# Patient Record
Sex: Male | Born: 1970
Health system: Southern US, Community
[De-identification: ages and names within clinical notes are randomized; demographics above are authoritative.]

## PROBLEM LIST (undated history)

## (undated) DIAGNOSIS — Z87442 Personal history of urinary calculi: Secondary | ICD-10-CM

## (undated) HISTORY — PX: APPENDECTOMY: SHX54

---

## 2001-04-02 ENCOUNTER — Ambulatory Visit (HOSPITAL_COMMUNITY): Admission: RE | Admit: 2001-04-02 | Discharge: 2001-04-02 | Payer: Self-pay | Admitting: Internal Medicine

## 2001-04-02 ENCOUNTER — Encounter: Payer: Self-pay | Admitting: Internal Medicine

## 2002-01-08 ENCOUNTER — Encounter: Payer: Self-pay | Admitting: Internal Medicine

## 2002-01-08 ENCOUNTER — Ambulatory Visit (HOSPITAL_COMMUNITY): Admission: RE | Admit: 2002-01-08 | Discharge: 2002-01-08 | Payer: Self-pay | Admitting: Internal Medicine

## 2007-08-14 ENCOUNTER — Other Ambulatory Visit: Admission: RE | Admit: 2007-08-14 | Discharge: 2007-08-14 | Payer: Self-pay | Admitting: Family Medicine

## 2007-08-14 ENCOUNTER — Encounter (INDEPENDENT_AMBULATORY_CARE_PROVIDER_SITE_OTHER): Payer: Self-pay | Admitting: Family Medicine

## 2007-08-17 ENCOUNTER — Inpatient Hospital Stay (HOSPITAL_COMMUNITY): Admission: EM | Admit: 2007-08-17 | Discharge: 2007-08-21 | Payer: Self-pay | Admitting: Emergency Medicine

## 2007-08-17 ENCOUNTER — Encounter (INDEPENDENT_AMBULATORY_CARE_PROVIDER_SITE_OTHER): Payer: Self-pay | Admitting: General Surgery

## 2007-09-13 ENCOUNTER — Ambulatory Visit: Payer: Self-pay | Admitting: Gastroenterology

## 2008-05-30 ENCOUNTER — Ambulatory Visit (HOSPITAL_COMMUNITY): Admission: RE | Admit: 2008-05-30 | Discharge: 2008-05-30 | Payer: Self-pay | Admitting: Family Medicine

## 2008-11-18 IMAGING — CT CT ABDOMEN W/O CM
1 of 2 series · 15 of 32 positions shown, 19 images · non-contrast
Comparison: None

CT ABDOMEN

CLINICAL DATA: Bilateral groin pain.  History of stones.

CT ABDOMEN AND PELVIS WITHOUT CONTRAST
TECHNIQUE: Multidetector CT imaging of the abdomen and pelvis was
performed following the standard
protocol without intravenous contrast.

[Series 2: stone 5.0 b40f · axial · 0.83mm/px · z∈[+462,+957]mm · 15 of 109 slices shown, 19 images]
[im 5/109  soft-tissue]
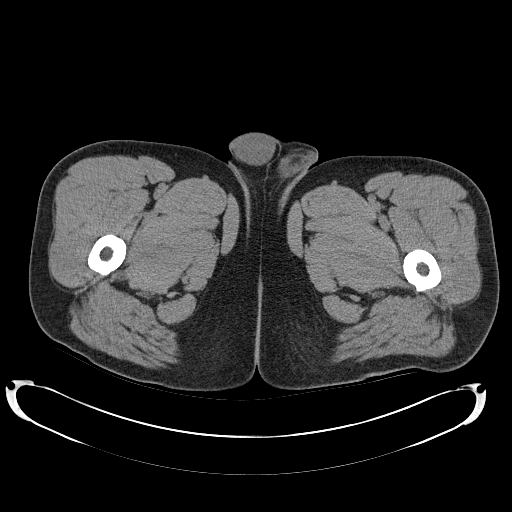
[im 5/109  bone]
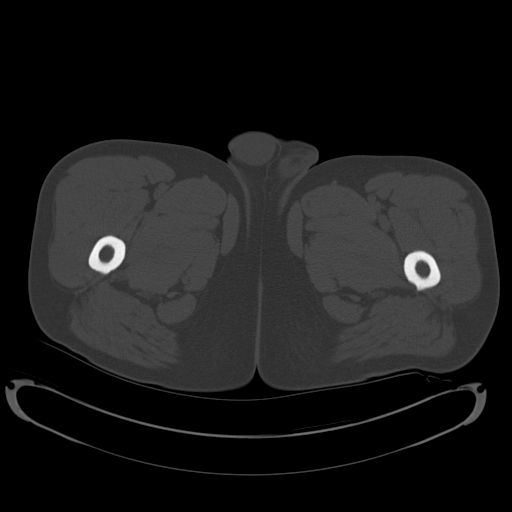
[im 13/109  soft-tissue]
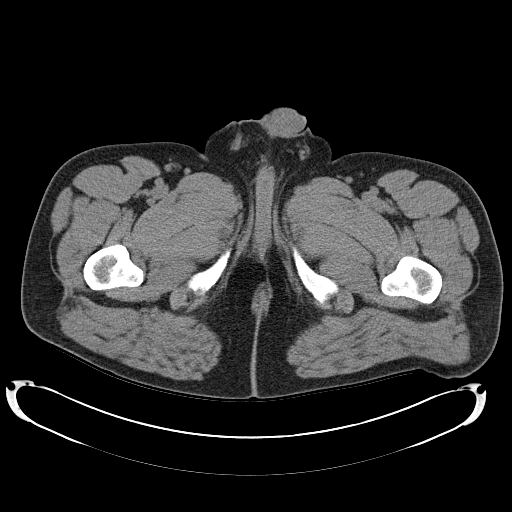
[im 22/109  soft-tissue]
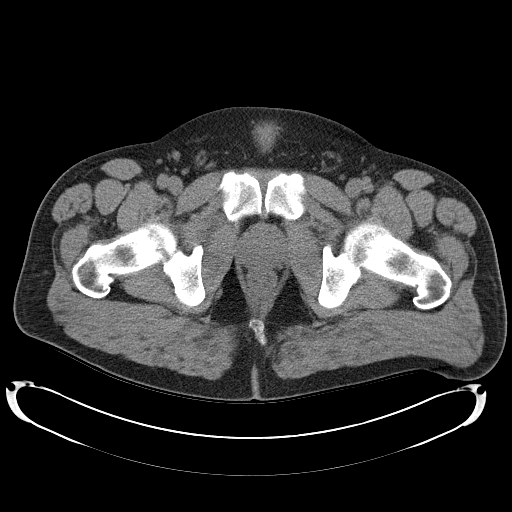
[im 31/109  soft-tissue]
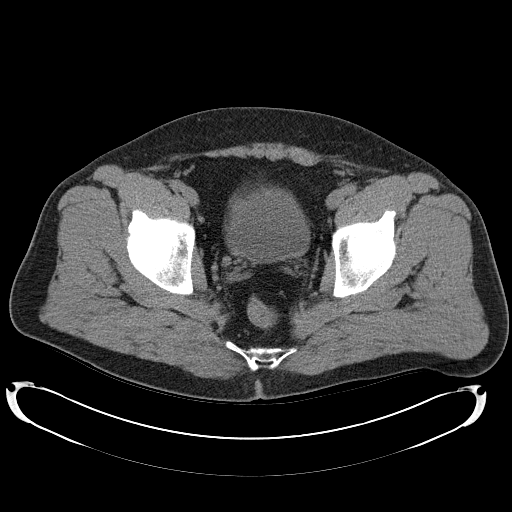
[im 39/109  soft-tissue]
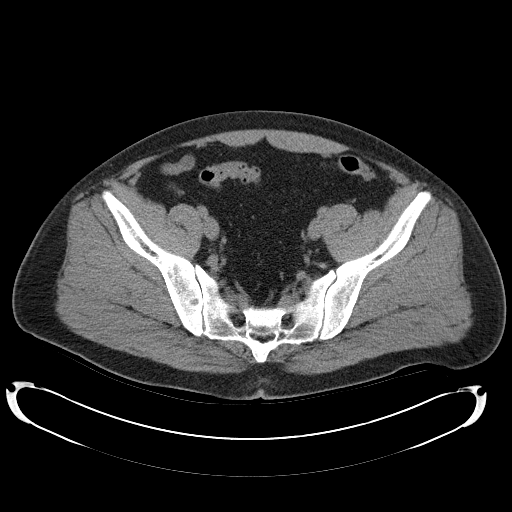
[im 48/109  soft-tissue]
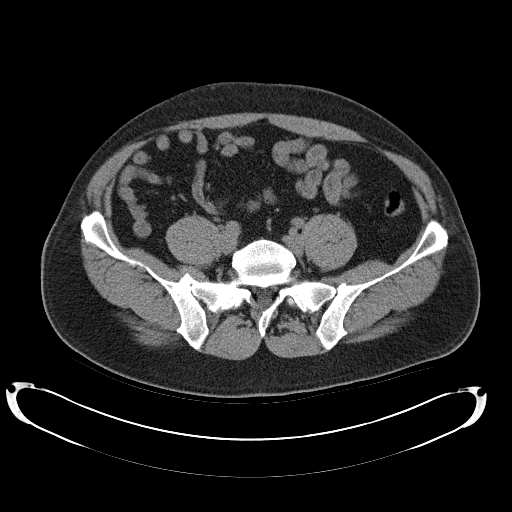
[im 57/109  soft-tissue]
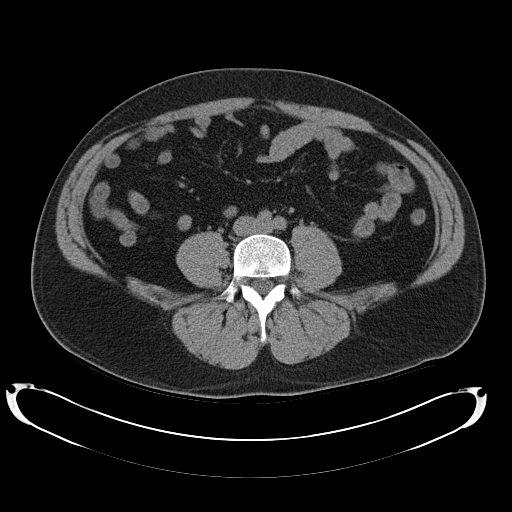
[im 61/109  soft-tissue]
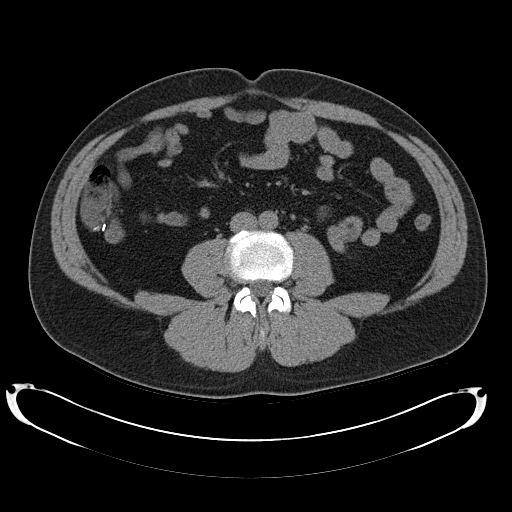
[im 70/109  soft-tissue]
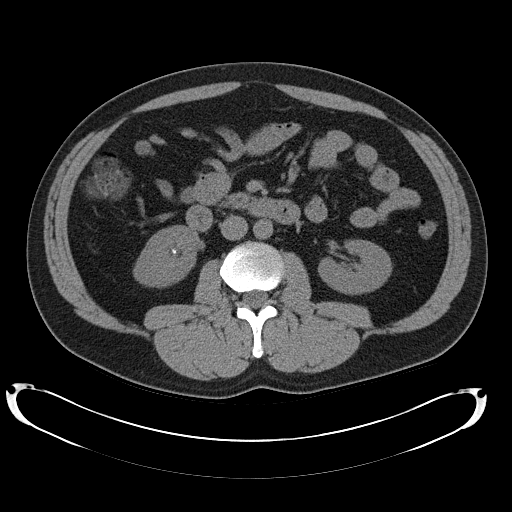
[im 70/109  bone]
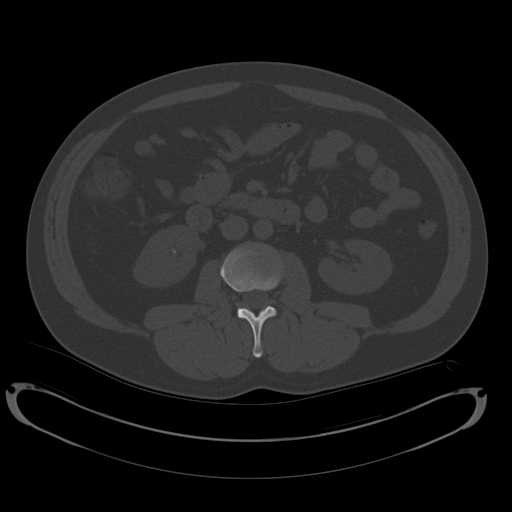
[im 78/109  soft-tissue]
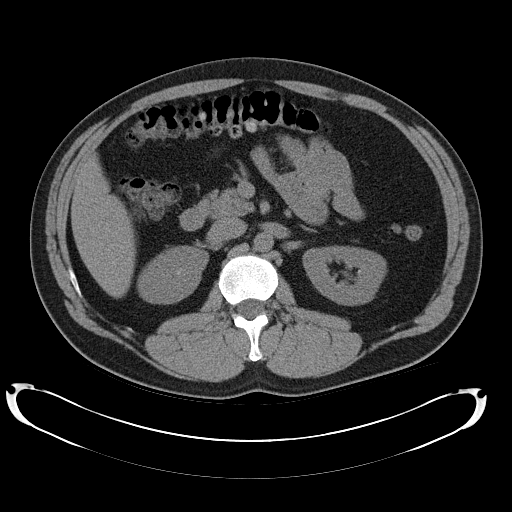
[im 87/109  soft-tissue]
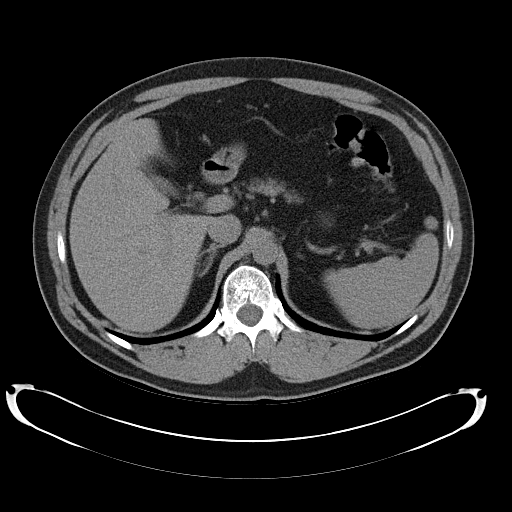
[im 91/109  lung]
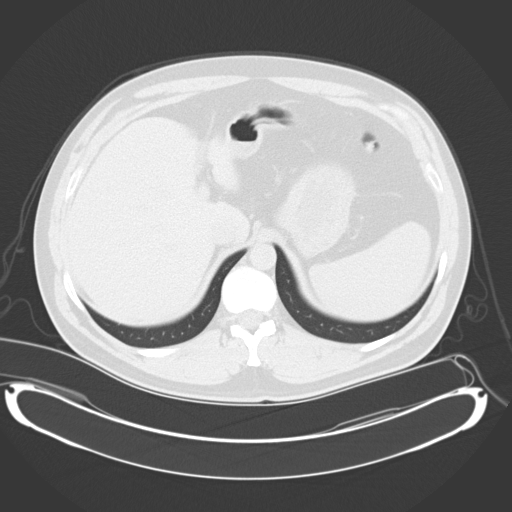
[im 96/109  soft-tissue]
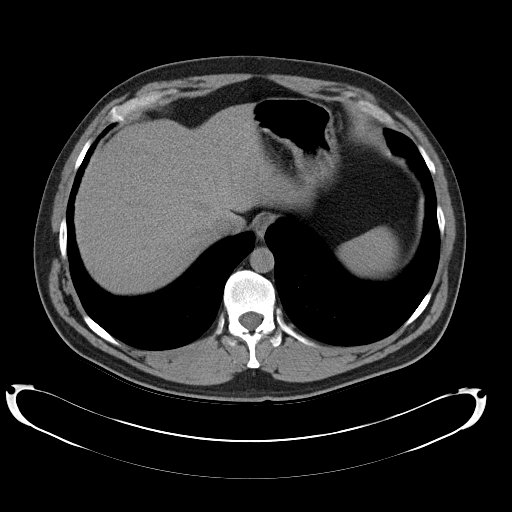
[im 96/109  lung]
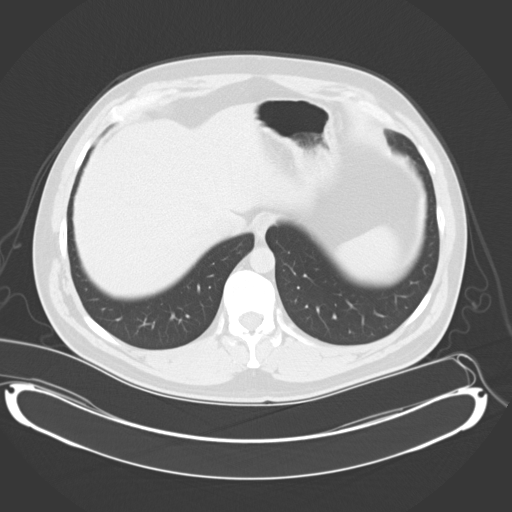
[im 100/109  lung]
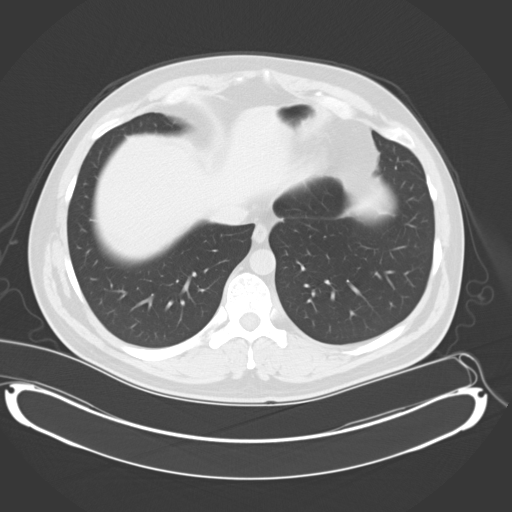
[im 104/109  soft-tissue]
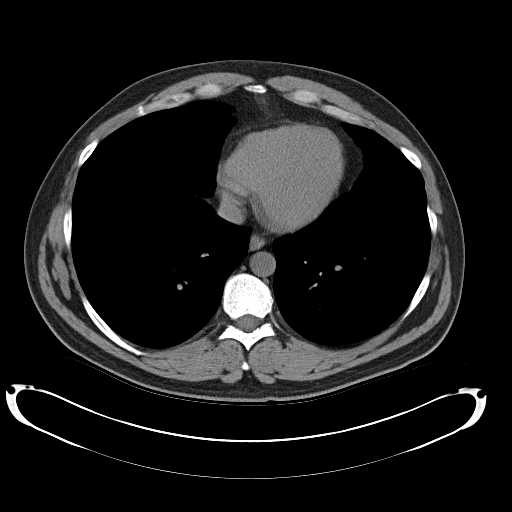
[im 104/109  lung]
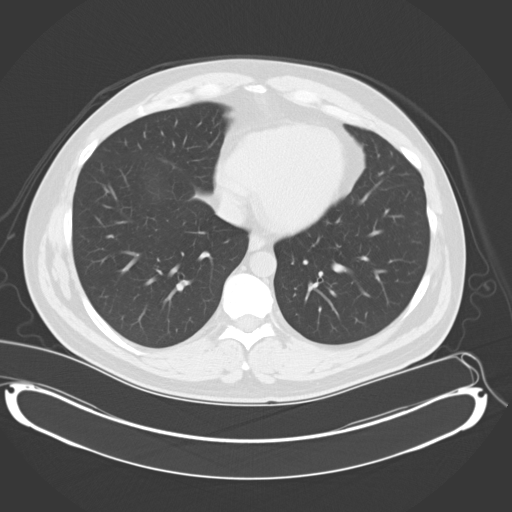

[15 of 32 positions shown; findings below may reference images not displayed]

FINDINGS: Lung bases show minimal scarring in the lingula.  Lung
bases otherwise clear.  Heart size normal.  No pericardial or
pleural effusion.

Liver, gallbladder, adrenal glands unremarkable.  There are small
stones in the kidneys bilaterally.  No hydronephrosis.  Spleen,
pancreas, stomach and small bowel unremarkable.  No pathologically
enlarged lymph nodes.  No free fluid.
IMPRESSION: 1.  Nonobstructing bilateral nephrolithiasis.

CT PELVIS
FINDINGS: There are small bilateral inguinal hernias containing
fat.  Colon unremarkable.  Postsurgical changes are seen from
suspected appendectomy.  No pathologically enlarged lymph nodes.
No free fluid.  No worrisome lytic or sclerotic lesions.
IMPRESSION: 1.  Small bilateral inguinal hernias contain fat.

## 2011-01-11 NOTE — Op Note (Signed)
Karl Aguilar, Karl Aguilar                 ACCOUNT NO.:  [id number]   MEDICAL RECORD NO.:  [id number]          PATIENT TYPE:  INP   LOCATION:  A310                          FACILITY:  [facility]   PHYSICIAN:  Barbaraann Barthel, M.D. DATE OF BIRTH:  07/25/1971   DATE OF PROCEDURE:  08/17/2007  DATE OF DISCHARGE:                               OPERATIVE REPORT   PREOPERATIVE DIAGNOSIS:  Acute appendicitis.   POSTOPERATIVE DIAGNOSIS:  1. Terminal ileitis.  2. Periappendicitis.   PROCEDURE:  Open appendectomy.   SURGEON:  Barbaraann Barthel, M.D.   SPECIMEN:  Appendix.   NOTE:  This is a 40 year old white male who had approximately a 12-14  hour history of right lower quadrant pain beginning at approximately 2  a.m. and increasing during the early morning hours the preceding day.  He came to Dr. Sherwood Gambler who was impressed that he had appendicitis and was  referred to me.  My clinical findings were highly suggestive of  appendicitis to the point where I did not think that a CT scan was  necessary as he had exquisite right lower quadrant pain at McBurney's  point.  His white count, however, was normal.  He was taken to surgery  with the presumptive diagnosis of acute appendicitis.   The procedure was discussed with him and his family in detail discussing  complications not limited to but including bleeding, infection and the  possibility of leaking from the peritoneal stump.  Informed consent was  obtained.   GROSS OPERATIVE FINDINGS:  The patient had a long retrocecal appendix  which was removed.  He had approximately 6 inches from the ileocecal  valve an area of terminal inflammation with mild amount of fat wrapping  and mild amount of suppuration around this area.  No abscess formation  or other areas and no obvious skip lesions found on the small bowel. No  mass was palpated.  The patient had no history of inflammatory bowel  disease or irritable bowel syndrome or history of any bloody diarrhea  to  suggest inflammatory bowel disease.  I suspect that this is a case of  terminal ileitis.   TECHNIQUE:  The patient was placed in the supine position after the  adequate administration of general anesthesia via endotracheal  intubation and a Foley catheter was aseptically inserted.  The abdomen  was prepped with Betadine solution and draped in the usual manner.  A  transverse incision was carried out over in the right lower quadrant  through skin, subcutaneous tissue through the rectus muscle sheath which  was divided and the rectus muscle was partially divided so the posterior  sheath was grasped with two clamps and the peritoneal cavity was then  opened and entered and explored with the above findings.  The appendix  was removed, clamping the mesoappendix and ligating it with 2-0 silk and  then dividing it with a TA-30 stapler device.  I used TA-30 stapler  twice as the first stapler misfired.  We then irrigated with normal  saline solution.  I ran the small bowel and there was an area  approximately  6 inches or so from the ileocecal valve that was  indurated.  There was no tumor present but it showed some inflammation  with mild suppuration around its sides.  We then after irrigating closed  the peritoneum with a running 0 Polysorb suture and the fascia with  interrupted figure-of-eight Polysorb sutures.  The subcu was irrigated  and the skin was approximated using stapling device.  I used a 0.5%  Sensorcaine approximately 20 mL around the incision to help with  postoperative comfort.  Sterile dressing was applied.  Prior to closure  all sponge, needle and instrument counts were found to be correct.  Estimated blood loss was minimal.  The patient received 1500-1600 mL of  crystalloids intraoperatively.  No drains were placed and there were no  complications.   I discussed the findings in detail with the family, also stating that  the patient will require antibiotics and will have  a GI consult if  necessary down the line and at least an evaluation further of his small  bowel.      Barbaraann Barthel, M.D.  Electronically Signed     WB/MEDQ  D:  08/17/2007  T:  08/19/2007  Job:  [id number]   cc:   Madelin Rear. Sherwood Gambler, MD  Fax: [phone]   R. Roetta Sessions, M.D.  [address]  [place]  [place] [address]

## 2011-01-11 NOTE — H&P (Signed)
Karl Aguilar, Karl Aguilar                 ACCOUNT NO.:  [id number]   MEDICAL RECORD NO.:  [id number]          PATIENT TYPE:  INP   LOCATION:  A310                          FACILITY:  [facility]   PHYSICIAN:  Barbaraann Barthel, M.D. DATE OF BIRTH:  1971/01/06   DATE OF ADMISSION:  08/17/2007  DATE OF DISCHARGE:  LH                              HISTORY & PHYSICAL   This is a 40 year old white male who presented to Dr. Sherwood Gambler with a less  than 12-hour history of right lower quadrant pain.   HISTORY OF PRESENT ILLNESS:  The patient states that he was awakened at  approximately 2:00 a.m. this morning with exquisite right lower quadrant  pain and this did not improve when he tried to move his bowels and  worsened so that he sought medical attention later on in the morning and  the clinical impression was that of acute appendicitis. Dr. Sherwood Gambler then  referred him to me.  I examined him in the emergency room and signs and  symptoms were pretty clearly clinically of acute appendicitis, so  classic that I really did not feel that a CT scan was necessary and did  not order one.   PHYSICAL EXAMINATION:  HEAD:  Normocephalic.  EYES:  Extraocular movements intact. Pupils are round and react to light  and accommodation.  There is no conjunctive pallor or scleral  injections. Scleral is a normal tincture.  NECK:  Supple and cylindrical without jugular vein distention,  thyromegaly or tracheal deviation. There is no cervical adenopathy.  No  bruits are auscultated.  CHEST:  Clear but diminished breath sounds bilaterally.  HEART:  Regular.  ABDOMEN:  The patient has diminished bowel sounds.  The patient has  exquisite right lower quadrant pain at McBurney's point with guarding.  No femoral inguinal hernias are appreciated.  GENITALIA:  Within normal limits.  RECTAL:  Prostate is smooth and the stool is guaiac negative.  EXTREMITIES:  Within normal limits without clubbing, varicosities or  cyanosis.   REVIEW OF  SYSTEMS:  CARDIORESPIRATORY:  The patient smokes a pack of  cigarettes per day.  No history of alcohol abuse. ENDOCRINE:  No history  of diabetes or thyroid disease. GU:  History of nephrolithiasis but no  dysuria present and the pain that he is having is not anywhere similar  to when the patient was having a renal colic. GI:  The patient has right  lower quadrant pain with nausea and anorexia.  No vomiting.  No history  of bright red rectal bleeding, black tarry stools or changes in his  bowel habit. No history of unexplained weight loss.  No history of  inflammatory bowel disease or inflammatory or irritable bowel syndrome.  No past history of hepatitis.   MEDICATIONS:  No medications on a regular basis.   ALLERGIES:  The patient has some nausea when he takes CODEINE but no  allergy per se.   As stated, he smokes a pack of cigarettes per day.  No history of  alcohol abuse.   VITAL SIGNS:  His temperature is 98.3.  His height is  6 feet and he  weighs 212.5 pounds.  His blood pressure is 145/91, heart rate 108,  respirations 20, O2 sat 100% on room air.   LABORATORY DATA:  Pending at the time of this dictation. It has been  ordered stat.   PLAN:  We will plan for laparotomy for appendectomy as discussed. We  discussed this in detail with him discussing complications not limited  to but including bleeding, infection and appendiceal stump leak.  Informed consent was obtained. We have initiated hydration and  antibiotic therapy and will plan for surgery as soon as possible.      Barbaraann Barthel, M.D.  Electronically Signed     WB/MEDQ  D:  08/17/2007  T:  08/18/2007  Job:  [id number]   cc:   Madelin Rear. Sherwood Gambler, MD  Fax: [phone]

## 2011-01-11 NOTE — Discharge Summary (Signed)
Karl Aguilar, KOHLENBERG                 ACCOUNT NO.:  [id number]   MEDICAL RECORD NO.:  [id number]          PATIENT TYPE:  INP   LOCATION:  A310                          FACILITY:  [facility]   PHYSICIAN:  Barbaraann Barthel, M.D. DATE OF BIRTH:  01/02/1971   DATE OF ADMISSION:  08/17/2007  DATE OF DISCHARGE:  12/23/2008LH                               DISCHARGE SUMMARY   DIAGNOSIS:  Terminal ileitis or periappendicitis (Final pathology  pending).   PROCEDURE:  On August 17, 2007, exploratory laparotomy and open  appendectomy.   NOTE:  This is a 40 year old white male who presented to the emergency  room with signs and symptoms of acute appendicitis.  He was seen earlier  by Dr. Sherwood Gambler without presumptive diagnosis, and clinically he had  exquisite right lower quadrant pain over McBurney's point with nausea  and vomiting.  I did not feel a CT scan was necessary with these  findings. His white count was normal.  However, his pain and symptoms  were that of acute appendicitis, and I took him to surgery without the  benefit of CT scan.   At surgery, he had a corkscrew retroperitoneal appendix that had peri-  inflammation, and an area of inflammation was noted about 6-8 cm from  the ileocecal junction.  This was localized. The rest of the small bowel  appeared to be normal.  Clinically, he had no previous history of any  inflammatory bowel disease in his family or any history of irritable  bowel syndrome and bloody diarrhea or anything suggesting an  inflammatory bowel procedure.  Clinically, this was not mass or a tumor.  This was inflammation with some thickened area and mild suppuration  noted around it.  I did not do a resection.  I washed out the right  lower quadrant and performed an appendectomy.   Postoperatively, the patient was placed on Cipro and Flagyl.  He was  discharged on the fourth postoperative day.  At the time of discharge,  his wound was clean.  He was moving his bowels  well.  He had no dysuria,  leg pain or shortness of breath.  He did have some mild superficial  phlebitis in his left upper arm from an IV placement and some chemical  phlebitis from his antibiotic infusion.  Otherwise, his postoperative  course was completely benign.  He did take a dip in his hemoglobin and  hematocrit; however, this was likely a dilutional problem.  On August 20, 2007, hemoglobin was 12.5 and hematocrit 36.8. At the time of  discharge, hemoglobin 13.3 and hematocrit  39.5.  His electrolytes were  grossly within normal limits.   As stated, his hospital course was benign.  He was discharged on Cipro  500 mg every 12 hours for another 5 days, Flagyl 500 mg every 8 hours  for 5 days.  He was told take no alcohol as this would cause him to have  problems with his Flagyl. He was given a prescription for Darvocet-N 100  one tablet every 4 hours for pain, Colace 100 mg to be taken daily  b.i.d. or every other day depending upon and the need for his bowels.  He was told to take no aspirin products and apply warm compresses for 20  minutes three times a day for his left upper arm phlebitis. He is  discharged on a full liquid and soft diet.  He was told to clean his  surgical wound with alcohol three times a day.  He was told to do no  heavy lifting, no driving, no sexual activities.  He is permitted to go  indoors, outdoors and up and down the stairs, however.  He is excused  from work, and we will see him in my office on December 29 at 2 o'clock.  He has been given appointment. He is told to contact us should there be  any acute changes.   Please note:  This patient will need further workup of his small bowel,  and we will consult later with Dr. Jena Gauss or Dr. Cira Servant regarding this.  He will likely need a small-bowel series or further GI workup regarding  his terminal ileitis.  Meanwhile, we will follow him perioperatively in  the office.      Barbaraann Barthel, M.D.   Electronically Signed     WB/MEDQ  D:  08/21/2007  T:  08/21/2007  Job:  [id number]   cc:   Madelin Rear. Sherwood Gambler, MD  Fax: [phone]   R. Roetta Sessions, M.D.  [address]  [place]  [place] [address]   Kassie Mends, M.D.  [address]  [place] , [place] [address]

## 2011-01-11 NOTE — Discharge Summary (Signed)
NAMEBYRAN, BILOTTI                 ACCOUNT NO.:  [id number]   MEDICAL RECORD NO.:  [id number]          PATIENT TYPE:  INP   LOCATION:  A310                          FACILITY:  [facility]   PHYSICIAN:  Barbaraann Barthel, M.D. DATE OF BIRTH:  12-23-1970   DATE OF ADMISSION:  08/17/2007  DATE OF DISCHARGE:  12/23/2008LH                               DISCHARGE SUMMARY   PROCEDURE:  Exploratory laparotomy and open appendectomy on August 17, 2007.   DIAGNOSIS:  Terminal ileitis and with periappendicitis.  Final pathology  pending.   Note this is a 40 year old white male who presented to the emergency  room with signs and symptoms of acute appendicitis.  He was seen earlier  by Dr. Sherwood Gambler who also had that impression.   His findings were exquisite right lower quadrant pain with nausea and  vomiting and and I did not feel the CT scan was warranted and he was  taken to surgery where in fact he was found to have terminal ileitis, an  area of approximately 6 to 8 inches from the ileocecal valve or the  ileocecal junction.  There was an area of localized inflammation.  There  was no other inflammation noted on the terminal.  The small bowel was  run.  There was no  to be the small bowel was run there was no   Please disregard this dictation   Dictation stopped.      Barbaraann Barthel, M.D.     WB/MEDQ  D:  08/21/2007  T:  08/22/2007  Job:  [id number]

## 2011-01-11 NOTE — Assessment & Plan Note (Signed)
NAMEROGER, KETTLES                  CHART#:  [id number]   DATE:  09/13/2007                       DOB:  1971-08-25   REFERRING PHYSICIAN:  Dr. Laurice Record and Dr. Malvin Johns.   DATE OF VISIT:  September 13, 2007.   REASON FOR CONSULTATION:  Small bowel ileitis.   HISTORY OF PRESENT ILLNESS:  Mr. Repsher is a 40 year old male who  presented to his primary physician in December 2008 with persistent  excruciating right lower quadrant pain.  He was taken to the operating  room for an exploratory laparotomy and an appendectomy was performed.  On gross examination six inches from the ileocecal valve showed  inflammation with a mild amount of fat wrapping and separation.  No  abscess or other lesions were seen in the small bowel.  Pathology on the  removed appendix showed benign appendiceal tissue with no significant  abnormalities.  He was placed on Cipro and Flagyl and completed his  antibiotics.  He has had no significant abdominal pain since having his  appendix removed.  He denies any diarrhea, rectal bleeding, constipation  or vomiting.  He does not use aspirin or anti inflammatory drugs because  they cause nausea.  He does have well water.  He takes Tylenol for  headaches.   PAST MEDICAL HISTORY:  Kidney stones.   PAST SURGICAL HISTORY:  Appendectomy.   ALLERGIES:  CODEINE and ASPIRIN.   MEDICATIONS:  None.   FAMILY HISTORY:  He has no family history of colon cancer, colon polyps,  ulcerative colitis or Crohn's disease.   SOCIAL HISTORY:  He is married for 16 years.  He works for a [employer].  He smokes 3/4 to 1 pack a day.  He occasionally consumes  alcohol.   PHYSICAL EXAMINATION:  Weight 209 pounds.  Height 5 foot 11.  BMI 29.1  (overweight).  Temperature 97.6, blood pressure 110/70, pulse 88.  GENERAL:  He is in no apparent distress.  Alert and oriented times four.  HEENT:  Atraumatic, normocephalic.  Pupils equal, reactive to light.  MOUTH:  No oral lesions.  Posterior  pharynx without erythema or exudate.  NECK:  Full range of motion.  No lymphadenopathy.  LUNGS:  Clear to auscultation bilaterally.  CARDIOVASCULAR EXAM:  Regular rhythm.  No murmur.  Normal S1 and S2.  ABDOMEN:  Bowel sounds are present.  Soft, nontender, non distended.  No  rebound.  No guarding.  EXTREMITIES:  Have no cyanosis or edema.  NEURO:  He has no focal neurologic deficits.   ASSESSMENT:  Mr. Mcquiston is a 40 year old male who had terminal ileitis,  most likely infectious in etiology.  His symptoms have resolved. Thank  you for allowing me to see Mr. Alonso in consultation.  My  recommendations follow.   RECOMMENDATIONS:  Mr. Volkert may follow up with me as needed.  He  understands that if his pain returns he should make a follow-up  appointment and we can re-evaluate him at that time.       Kassie Mends, M.D.  Electronically Signed     SM/MEDQ  D:  09/13/2007  T:  09/13/2007  Job:  [id number]   cc:   Barbaraann Barthel, M.D.

## 2011-04-26 ENCOUNTER — Emergency Department (HOSPITAL_COMMUNITY)
Admission: EM | Admit: 2011-04-26 | Discharge: 2011-04-26 | Disposition: A | Discharge status: Home / Self Care | Payer: Worker's Compensation | Attending: Emergency Medicine | Admitting: Emergency Medicine

## 2011-04-26 DIAGNOSIS — W208XXA Other cause of strike by thrown, projected or falling object, initial encounter: Secondary | ICD-10-CM | POA: Insufficient documentation

## 2011-04-26 DIAGNOSIS — IMO0002 Reserved for concepts with insufficient information to code with codable children: Secondary | ICD-10-CM | POA: Insufficient documentation

## 2011-04-26 DIAGNOSIS — S62639A Displaced fracture of distal phalanx of unspecified finger, initial encounter for closed fracture: Secondary | ICD-10-CM | POA: Insufficient documentation

## 2011-04-26 DIAGNOSIS — M79609 Pain in unspecified limb: Secondary | ICD-10-CM | POA: Insufficient documentation

## 2011-04-26 DIAGNOSIS — S61209A Unspecified open wound of unspecified finger without damage to nail, initial encounter: Secondary | ICD-10-CM | POA: Insufficient documentation

## 2011-04-26 DIAGNOSIS — Y990 Civilian activity done for income or pay: Secondary | ICD-10-CM | POA: Insufficient documentation

## 2011-06-03 LAB — BASIC METABOLIC PANEL
BUN: 12
BUN: 9
CO2: 27
CO2: 28
Calcium: 8 — ABNORMAL LOW
Calcium: 9.4
Chloride: 102
Chloride: 105
Creatinine, Ser: 1.13
GFR calc Af Amer: >60
GFR calc Af Amer: >60
GFR calc non Af Amer: >60
GFR calc non Af Amer: >60
Glucose, Bld: 110 — ABNORMAL HIGH
Glucose, Bld: 90
Potassium: 3.6
Potassium: 4
Potassium: 4.2
Sodium: 136
Sodium: 138

## 2011-06-03 LAB — DIFFERENTIAL
Basophils Absolute: 0.1
Basophils Absolute: 0.1
Basophils Absolute: 0.2 — ABNORMAL HIGH
Basophils Relative: 1
Basophils Relative: 2 — ABNORMAL HIGH
Eosinophils Absolute: 0.4
Eosinophils Absolute: 0.6
Eosinophils Relative: 1
Eosinophils Relative: 3
Eosinophils Relative: 6 — ABNORMAL HIGH
Lymphocytes Relative: 15
Lymphocytes Relative: 16
Lymphocytes Relative: 21
Lymphs Abs: 2
Lymphs Abs: 2
Lymphs Abs: 2.6
Monocytes Absolute: 1
Monocytes Absolute: 1.6 — ABNORMAL HIGH
Monocytes Relative: 10
Monocytes Relative: 10
Neutro Abs: 11.7 — ABNORMAL HIGH
Neutro Abs: 6.1
Neutro Abs: 9.8 — ABNORMAL HIGH
Neutrophils Relative %: 62
Neutrophils Relative %: 71

## 2011-06-03 LAB — CBC
HCT: 36.8 — ABNORMAL LOW
HCT: 40.7
Hemoglobin: 12.5 — ABNORMAL LOW
Hemoglobin: 13.9
MCHC: 33.7
MCHC: 33.9
MCHC: 34.1
MCV: 86.5
MCV: 86.8
MCV: 86.9
Platelets: 262
Platelets: 267
RBC: 4.26
RBC: 4.68
RDW: 13.1
RDW: 13.2
RDW: 13.4
WBC: 13.4 — ABNORMAL HIGH
WBC: 9.8

## 2011-06-03 LAB — HEMOGLOBIN AND HEMATOCRIT, BLOOD
HCT: 39.5
Hemoglobin: 13.3

## 2013-07-28 ENCOUNTER — Emergency Department (HOSPITAL_COMMUNITY): Payer: BC Managed Care – PPO

## 2013-07-28 ENCOUNTER — Emergency Department (HOSPITAL_COMMUNITY)
Admission: EM | Admit: 2013-07-28 | Discharge: 2013-07-28 | Disposition: A | Discharge status: Home / Self Care | Payer: BC Managed Care – PPO | Attending: Emergency Medicine | Admitting: Emergency Medicine

## 2013-07-28 ENCOUNTER — Encounter (HOSPITAL_COMMUNITY): Payer: Self-pay | Admitting: Emergency Medicine

## 2013-07-28 DIAGNOSIS — K8050 Calculus of bile duct without cholangitis or cholecystitis without obstruction: Secondary | ICD-10-CM

## 2013-07-28 DIAGNOSIS — F17200 Nicotine dependence, unspecified, uncomplicated: Secondary | ICD-10-CM | POA: Insufficient documentation

## 2013-07-28 DIAGNOSIS — R079 Chest pain, unspecified: Secondary | ICD-10-CM | POA: Insufficient documentation

## 2013-07-28 DIAGNOSIS — R61 Generalized hyperhidrosis: Secondary | ICD-10-CM | POA: Insufficient documentation

## 2013-07-28 DIAGNOSIS — K8020 Calculus of gallbladder without cholecystitis without obstruction: Secondary | ICD-10-CM | POA: Insufficient documentation

## 2013-07-28 LAB — CBC WITH DIFFERENTIAL/PLATELET
Basophils Relative: 1 % (ref 0–1)
Eosinophils Relative: 3 % (ref 0–5)
HCT: 49.6 % (ref 39.0–52.0)
Hemoglobin: 16.5 g/dL (ref 13.0–17.0)
MCH: 29.7 pg (ref 26.0–34.0)
MCHC: 33.3 g/dL (ref 30.0–36.0)
MCV: 89.2 fL (ref 78.0–100.0)
Monocytes Absolute: 0.8 10*3/uL (ref 0.1–1.0)
Monocytes Relative: 7 % (ref 3–12)
Neutro Abs: 7.4 10*3/uL (ref 1.7–7.7)

## 2013-07-28 LAB — COMPREHENSIVE METABOLIC PANEL
Albumin: 3.8 g/dL (ref 3.5–5.2)
BUN: 17 mg/dL (ref 6–23)
CO2: 25 mEq/L (ref 19–32)
Calcium: 9.6 mg/dL (ref 8.4–10.5)
Chloride: 101 mEq/L (ref 96–112)
Creatinine, Ser: 1.1 mg/dL (ref 0.50–1.35)
GFR calc non Af Amer: 81 mL/min — ABNORMAL LOW (ref >90)
Total Bilirubin: 0.3 mg/dL (ref 0.3–1.2)

## 2013-07-28 LAB — TROPONIN I: Troponin I: <0.3 ng/mL (ref <0.30)

## 2013-07-28 LAB — LIPASE, BLOOD: Lipase: 76 U/L — ABNORMAL HIGH (ref 11–59)

## 2013-07-28 MED ORDER — HYDROCODONE-ACETAMINOPHEN 5-325 MG PO TABS: 2.0000 | ORAL_TABLET | ORAL | Status: DC | PRN

## 2013-07-28 MED ORDER — GI COCKTAIL ~~LOC~~
ORAL | Status: AC
  Filled 2013-07-28: qty 30

## 2013-07-28 MED ORDER — GI COCKTAIL ~~LOC~~
30.0000 mL | Freq: Once | ORAL | Status: AC
  Administered 2013-07-28: 30 mL via ORAL

## 2013-07-28 NOTE — ED Provider Notes (Signed)
CSN: [id number]     Arrival date & time 07/28/13  2440 History   This chart was scribed for Geoffery Lyons, MD, by Yevette Edwards, ED Scribe. This patient was seen in room APA02/APA02 and the patient's care was started at 7:05 AM.   First MD Initiated Contact with Patient 07/28/13 [phone]     Chief Complaint  Patient presents with  . Chest Pain    The history is provided by the patient and the spouse. No language interpreter was used.   HPI Comments: Karl Aguilar is a 42 y.o. male who presents to the Emergency Department complaining of acute mid-center, non-radiating chest pain which woke him from sleep two hours ago this morning. He characterizes the chest pain as "burning" and "sharp" like a knife. He reports he has never experienced similar chest pain. The pt states that the pain is improved with belching, and that at bedside the pain has subsided to a dullness. He experienced mild diaphoresis with the chest pain, but he attributes it to the multiple layers of clothes he was wearing. The pt denies any SOB, nausea, dizziness, lower extremity swelling, abdominal pain, or other pain. 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 He ate fried fish and hush-puppies yesterday evening. He denies high cholesterol, DM, gallbladder issues, or other pertinent health problems. His father had a mild MI when he was 41 years old. The pt is a daily smoker.   History reviewed. No pertinent past medical history. Past Surgical History  Procedure Laterality Date  . Appendectomy     No family history on file. History  Substance Use Topics  . Smoking status: Current Every Day Smoker -- 1.00 packs/day    Types: Cigarettes  . Smokeless tobacco: Not on file  . Alcohol Use: Yes    Review of Systems  Constitutional: Positive for diaphoresis.  Respiratory: Negative for shortness of breath.   Cardiovascular: Positive for chest  pain. Negative for leg swelling.  Gastrointestinal: Negative for nausea, vomiting and abdominal pain.  Neurological: Negative for dizziness.  All other systems reviewed and are negative.    Allergies  Asa  Home Medications  No current outpatient prescriptions on file.  Triage Vitals: BP 114/87  Pulse 79  Temp(Src) 97.6 F (36.4 C) (Oral)  Resp 16  Ht 5\' 11"  (1.803 m)  Wt 205 lb (92.987 kg)  BMI 28.60 kg/m2  SpO2 96%  Physical Exam  Nursing note and vitals reviewed. Constitutional: He is oriented to person, place, and time. He appears well-developed and well-nourished. No distress.  HENT:  Head: Normocephalic and atraumatic.  Eyes: Conjunctivae and EOM are normal. Pupils are equal, round, and reactive to light.  Neck: Neck supple. No tracheal deviation present.  Cardiovascular: Normal rate, regular rhythm, normal heart sounds and intact distal pulses.   No murmur heard. Pulmonary/Chest: Effort normal and breath sounds normal. No respiratory distress. He has no wheezes.  Abdominal: Soft. Bowel sounds are normal. He exhibits no distension. There is no tenderness. There is no rebound and no guarding.  Musculoskeletal: Normal range of motion. He exhibits no edema.  Neurological: He is alert and oriented to person, place, and time.  Skin: Skin is warm and dry.  Psychiatric: He has a normal mood and affect. His behavior is normal.    ED Course  Procedures (including critical care time)  DIAGNOSTIC STUDIES: Oxygen Saturation is 96% on room  air, normal by my interpretation.    COORDINATION OF CARE:  7:17 AM- Discussed treatment plan with patient, and the patient agreed to the plan.   8:04 AM- Rechecked pt. Informed pt of his lab results. Will perform an US of the pt's gallbladder. Pt agreed.   9:54 AM- Rechecked pt. Informed pt of US findings.   Labs Review Labs Reviewed  CBC WITH DIFFERENTIAL - Abnormal; Notable for the following:    WBC 10.6 (*)    All other  components within normal limits  COMPREHENSIVE METABOLIC PANEL - Abnormal; Notable for the following:    Glucose, Bld 187 (*)    GFR calc non Af Amer 81 (*)    All other components within normal limits  LIPASE, BLOOD - Abnormal; Notable for the following:    Lipase 76 (*)    All other components within normal limits  TROPONIN I  TROPONIN I   Imaging Review Dg Chest Portable 1 View  07/28/2013   CLINICAL DATA:  Chest pain  EXAM: PORTABLE CHEST - 1 VIEW  COMPARISON:  None available  FINDINGS: Linear scarring or subsegmental atelectasis at the left lung base. Right lung clear. Heart size upper limits normal. No effusion. Regional bones unremarkable.  IMPRESSION: Minimal atelectasis or scarring at the left lung base   Electronically Signed   By: Oley Balm M.D.   On: 07/28/2013 07:21   US Abdomen Limited Ruq  07/28/2013   CLINICAL DATA:  Right upper quadrant abdominal pain  EXAM: US ABDOMEN LIMITED - RIGHT UPPER QUADRANT  COMPARISON:  None.  FINDINGS: Gallbladder  Cholelithiasis with gallbladder wall thickening/edema, measuring up to 9 mm. Gallbladder is not distended. Negative sonographic Murphy sign.  Common bile duct  Diameter: 5 mm.  Liver:  No focal lesion identified. Within normal limits in parenchymal echogenicity.  IMPRESSION: Cholelithiasis with gallbladder wall thickening/edema. Negative sonographic Murphy's sign.  This appearance remains equivocal, but early acute cholecystitis is not excluded. Consider hepatobiliary nuclear medicine scan for further evaluation.   Electronically Signed   By: Charline Bills M.D.   On: 07/28/2013 09:43    EKG Interpretation    Date/Time:  Sunday July 28 2013 06:43:31 EST Ventricular Rate:  81 PR Interval:  146 QRS Duration: 88 QT Interval:  358 QTC Calculation: 415 R Axis:   4 Text Interpretation:  Normal sinus rhythm Normal ECG No previous ECGs available Confirmed by CAMPOS  MD, KEVIN ([id number]) on 07/28/2013 6:57:26 AM             MDM  No diagnosis found. Patient is a 42 year old male presents to the emergency department with complaints of severe epigastric and lower chest pain that started suddenly this morning. And apparently a large meal of fried food last night but was feeling fine at bedtime. Workup reveals a mildly elevated white count with a mildly elevated lipase. An ultrasound was obtained which reveals cholelithiasis with gallbladder wall thickening/edema. His pain is currently subsided and he is pain free. I discussed these results with Dr. Lovell Sheehan from general surgery who recommends followup in the office tomorrow. He will be given pain medication in case his pain recurs instructions to followup tomorrow. He is to return to the ER if he develops fever, severe vomiting, or worsening pain.  Workup today reveals a normal EKG and negative troponin x2. I do not feel as though this is a cardiac problem and do not feel as further workup is indicated into this at this time.  I personally performed  the services described in this documentation, which was scribed in my presence. The recorded information has been reviewed and is accurate.       Geoffery Lyons, MD 07/28/13 1009

## 2013-07-28 NOTE — ED Notes (Signed)
Pt states he woke this morning having pain to the center of his chest. Pain is non radiating.

## 2013-07-28 NOTE — ED Notes (Signed)
Pt c/o "burning" centralized chest pain that began this morning. Pt denies SOB, nausea or any radiating pain. Pt states pain has decreased since arrival to ED.

## 2013-07-30 ENCOUNTER — Encounter (HOSPITAL_COMMUNITY): Payer: Self-pay

## 2013-07-30 NOTE — H&P (Signed)
  NTS SOAP Note  Vital Signs:  Vitals as of: 07/30/2013: Systolic 130: Diastolic 83: Heart Rate 86: Temp 98.58F: Height 60ft 11in: Weight 200Lbs 0 Ounces: Pain Level 2: BMI 27.89  BMI : 27.89 kg/m2  Subjective: This 42 Years 0 Months old Male presents for of abdominal pain.  Was seen in ER for right upper quadrant abdominal pain.  Has been occurring intermittently recently.  U/S of gallbladder reveals cholelithiasis, thickened gallbladder wall, normal common bile duct.  No fever, chills, jaundice.  Review of Symptoms:  Constitutional:  weakness Head:unremarkable    Eyes:  pain bilateral Nose/Mouth/Throat:unremarkable Cardiovascular:  unremarkable   Respiratory:unremarkable   Gastrointestin    abdominal pain,heartburn Genitourinary:unremarkable     Musculoskeletal:unremarkable   Skin:unremarkable Hematolgic/Lymphatic:unremarkable     Allergic/Immunologic:unremarkable     Past Medical History:    Reviewed   Past Medical History  Surgical History: appendectomy Medical Problems: none Allergies: nkda Medications: none   Social History:Reviewed  Social History  Preferred Language: English Race:  White Ethnicity: Not Hispanic / Latino Age: 42 Years 0 Months Marital Status:  M Alcohol: rarely Recreational drug(s):  No   Smoking Status: Current every day smoker reviewed on 07/30/2013 Started Date: 08/29/1988 Packs per day: 1.00 Functional Status reviewed on mm/dd/yyyy ------------------------------------------------ Bathing: Normal Cooking: Normal Dressing: Normal Driving: Normal Eating: Normal Managing Meds: Normal Oral Care: Normal Shopping: Normal Toileting: Normal Transferring: Normal Walking: Normal Cognitive Status reviewed on mm/dd/yyyy ------------------------------------------------ Attention: Normal Decision Making: Normal Language: Normal Memory: Normal Motor: Normal Perception: Normal Problem  Solving: Normal Visual and Spatial: Normal   Family History:  Reviewed  Family Health History Mother, Living; Healthy; healthy Father, Living; Healthy; healthy    Objective Information: General:  Well appearing, well nourished in no distress.   no scleral icterus Heart:  RRR, no murmur Lungs:    CTA bilaterally, no wheezes, rhonchi, rales.  Breathing unlabored. Abdomen:Soft, tender in right upper quadrant to palpation, ND, no HSM, no masses.  Assessment:cholecystitis, cholelithiasis  Diagnosis &amp; Procedure Smart Code   Plan:Scheduled for laparoscopic cholecystectomy on 08/02/13.   Patient Education:Alternative treatments to surgery were discussed with patient (and family).  Risks and benefits  of procedure including bleeding, infection, hepatobiliary injury, and the possibility of an open procedure were fully explained to the patient (and family) who gave informed consent. Patient/family questions were addressed.  Follow-up:Pending Surgery

## 2013-07-31 ENCOUNTER — Encounter (HOSPITAL_COMMUNITY)
Admission: RE | Admit: 2013-07-31 | Discharge: 2013-07-31 | Disposition: A | Discharge status: Home / Self Care | Payer: BC Managed Care – PPO | Source: Ambulatory Visit

## 2013-08-02 ENCOUNTER — Ambulatory Visit (HOSPITAL_COMMUNITY)
Admission: RE | Admit: 2013-08-02 | Discharge: 2013-08-02 | Disposition: A | Discharge status: Home / Self Care | Payer: BC Managed Care – PPO | Source: Ambulatory Visit | Attending: General Surgery | Admitting: General Surgery

## 2013-08-02 ENCOUNTER — Ambulatory Visit (HOSPITAL_COMMUNITY): Payer: BC Managed Care – PPO | Admitting: Anesthesiology

## 2013-08-02 ENCOUNTER — Encounter (HOSPITAL_COMMUNITY): Payer: BC Managed Care – PPO | Admitting: Anesthesiology

## 2013-08-02 ENCOUNTER — Encounter (HOSPITAL_COMMUNITY): Payer: Self-pay | Admitting: *Deleted

## 2013-08-02 ENCOUNTER — Encounter (HOSPITAL_COMMUNITY)
Admission: RE | Disposition: A | Discharge status: Home / Self Care | Payer: Self-pay | Source: Ambulatory Visit | Attending: General Surgery

## 2013-08-02 DIAGNOSIS — K819 Cholecystitis, unspecified: Secondary | ICD-10-CM | POA: Insufficient documentation

## 2013-08-02 DIAGNOSIS — K8010 Calculus of gallbladder with chronic cholecystitis without obstruction: Secondary | ICD-10-CM | POA: Insufficient documentation

## 2013-08-02 DIAGNOSIS — Z01812 Encounter for preprocedural laboratory examination: Secondary | ICD-10-CM | POA: Insufficient documentation

## 2013-08-02 HISTORY — PX: CHOLECYSTECTOMY: SHX55

## 2013-08-02 SURGERY — LAPAROSCOPIC CHOLECYSTECTOMY
Anesthesia: General | Site: Abdomen

## 2013-08-02 MED ORDER — KETOROLAC TROMETHAMINE 30 MG/ML IJ SOLN
30.0000 mg | Freq: Once | INTRAMUSCULAR | Status: AC
  Administered 2013-08-02: 30 mg via INTRAVENOUS

## 2013-08-02 MED ORDER — ONDANSETRON HCL 4 MG/2ML IJ SOLN
INTRAMUSCULAR | Status: AC
  Filled 2013-08-02: qty 2

## 2013-08-02 MED ORDER — LIDOCAINE HCL 1 % IJ SOLN
INTRAMUSCULAR | Status: DC | PRN
  Administered 2013-08-02: 40 mg via INTRADERMAL

## 2013-08-02 MED ORDER — PROPOFOL 10 MG/ML IV EMUL
INTRAVENOUS | Status: AC
  Filled 2013-08-02: qty 20

## 2013-08-02 MED ORDER — DEXAMETHASONE SODIUM PHOSPHATE 4 MG/ML IJ SOLN
4.0000 mg | Freq: Once | INTRAMUSCULAR | Status: AC
  Administered 2013-08-02: 4 mg via INTRAVENOUS

## 2013-08-02 MED ORDER — ROCURONIUM BROMIDE 50 MG/5ML IV SOLN
INTRAVENOUS | Status: AC
  Filled 2013-08-02: qty 1

## 2013-08-02 MED ORDER — DEXAMETHASONE SODIUM PHOSPHATE 4 MG/ML IJ SOLN
INTRAMUSCULAR | Status: AC
  Filled 2013-08-02: qty 1

## 2013-08-02 MED ORDER — LIDOCAINE HCL (PF) 1 % IJ SOLN
INTRAMUSCULAR | Status: AC
  Filled 2013-08-02: qty 5

## 2013-08-02 MED ORDER — ONDANSETRON HCL 4 MG/2ML IJ SOLN
4.0000 mg | Freq: Once | INTRAMUSCULAR | Status: AC
  Administered 2013-08-02: 4 mg via INTRAVENOUS

## 2013-08-02 MED ORDER — FENTANYL CITRATE 0.05 MG/ML IJ SOLN
INTRAMUSCULAR | Status: AC
  Filled 2013-08-02: qty 2

## 2013-08-02 MED ORDER — CLINDAMYCIN PHOSPHATE 900 MG/50ML IV SOLN
900.0000 mg | Freq: Once | INTRAVENOUS | Status: AC
  Administered 2013-08-02: 900 mg via INTRAVENOUS

## 2013-08-02 MED ORDER — FENTANYL CITRATE 0.05 MG/ML IJ SOLN
25.0000 ug | INTRAMUSCULAR | Status: DC | PRN
  Administered 2013-08-02: 50 ug via INTRAVENOUS

## 2013-08-02 MED ORDER — FENTANYL CITRATE 0.05 MG/ML IJ SOLN
INTRAMUSCULAR | Status: AC
  Filled 2013-08-02: qty 5

## 2013-08-02 MED ORDER — SUCCINYLCHOLINE CHLORIDE 20 MG/ML IJ SOLN
INTRAMUSCULAR | Status: DC | PRN
  Administered 2013-08-02: 120 mg via INTRAVENOUS

## 2013-08-02 MED ORDER — PROPOFOL 10 MG/ML IV BOLUS
INTRAVENOUS | Status: DC | PRN
  Administered 2013-08-02: 175 mg via INTRAVENOUS

## 2013-08-02 MED ORDER — MIDAZOLAM HCL 2 MG/2ML IJ SOLN
1.0000 mg | INTRAMUSCULAR | Status: DC | PRN
  Administered 2013-08-02 (×2): 2 mg via INTRAVENOUS

## 2013-08-02 MED ORDER — BUPIVACAINE HCL (PF) 0.5 % IJ SOLN
INTRAMUSCULAR | Status: DC | PRN
  Administered 2013-08-02: 10 mL

## 2013-08-02 MED ORDER — POVIDONE-IODINE 10 % EX OINT
TOPICAL_OINTMENT | CUTANEOUS | Status: AC
  Filled 2013-08-02: qty 1

## 2013-08-02 MED ORDER — MIDAZOLAM HCL 2 MG/2ML IJ SOLN
INTRAMUSCULAR | Status: AC
  Filled 2013-08-02: qty 2

## 2013-08-02 MED ORDER — SODIUM CHLORIDE 0.9 % IR SOLN
Status: DC | PRN
  Administered 2013-08-02: 500 mL

## 2013-08-02 MED ORDER — GLYCOPYRROLATE 0.2 MG/ML IJ SOLN
INTRAMUSCULAR | Status: AC
  Filled 2013-08-02: qty 3

## 2013-08-02 MED ORDER — CHLORHEXIDINE GLUCONATE 4 % EX LIQD: 1.0000 "application " | Freq: Once | CUTANEOUS | Status: DC

## 2013-08-02 MED ORDER — POVIDONE-IODINE 10 % OINT PACKET
TOPICAL_OINTMENT | CUTANEOUS | Status: DC | PRN
  Administered 2013-08-02: 1 via TOPICAL

## 2013-08-02 MED ORDER — ROCURONIUM BROMIDE 100 MG/10ML IV SOLN
INTRAVENOUS | Status: DC | PRN
  Administered 2013-08-02: 5 mg via INTRAVENOUS
  Administered 2013-08-02: 25 mg via INTRAVENOUS

## 2013-08-02 MED ORDER — LACTATED RINGERS IV SOLN
INTRAVENOUS | Status: DC
  Administered 2013-08-02: 08:00:00 via INTRAVENOUS

## 2013-08-02 MED ORDER — SUCCINYLCHOLINE CHLORIDE 20 MG/ML IJ SOLN
INTRAMUSCULAR | Status: AC
  Filled 2013-08-02: qty 1

## 2013-08-02 MED ORDER — KETOROLAC TROMETHAMINE 30 MG/ML IJ SOLN
INTRAMUSCULAR | Status: AC
  Filled 2013-08-02: qty 1

## 2013-08-02 MED ORDER — ONDANSETRON HCL 4 MG/2ML IJ SOLN
4.0000 mg | Freq: Once | INTRAMUSCULAR | Status: AC | PRN
  Administered 2013-08-02: 4 mg via INTRAVENOUS
  Filled 2013-08-02: qty 2

## 2013-08-02 MED ORDER — CLINDAMYCIN PHOSPHATE 900 MG/50ML IV SOLN
INTRAVENOUS | Status: AC
  Filled 2013-08-02: qty 50

## 2013-08-02 MED ORDER — NEOSTIGMINE METHYLSULFATE 1 MG/ML IJ SOLN
INTRAMUSCULAR | Status: DC | PRN
  Administered 2013-08-02: .5 mg via INTRAVENOUS

## 2013-08-02 MED ORDER — HEMOSTATIC AGENTS (NO CHARGE) OPTIME
TOPICAL | Status: DC | PRN
  Administered 2013-08-02: 1 via TOPICAL

## 2013-08-02 MED ORDER — OXYCODONE-ACETAMINOPHEN 7.5-325 MG PO TABS: 1.0000 | ORAL_TABLET | ORAL | Status: DC | PRN

## 2013-08-02 MED ORDER — FENTANYL CITRATE 0.05 MG/ML IJ SOLN
INTRAMUSCULAR | Status: DC | PRN
  Administered 2013-08-02 (×7): 50 ug via INTRAVENOUS

## 2013-08-02 MED ORDER — BUPIVACAINE HCL (PF) 0.5 % IJ SOLN
INTRAMUSCULAR | Status: AC
  Filled 2013-08-02: qty 30

## 2013-08-02 SURGICAL SUPPLY — 41 items
APPLIER CLIP LAPSCP 10X32 DD (CLIP) ×2 IMPLANT
BAG HAMPER (MISCELLANEOUS) ×2 IMPLANT
CLOTH BEACON ORANGE TIMEOUT ST (SAFETY) ×2 IMPLANT
COVER LIGHT HANDLE STERIS (MISCELLANEOUS) ×4 IMPLANT
DECANTER SPIKE VIAL GLASS SM (MISCELLANEOUS) ×2 IMPLANT
DURAPREP 26ML APPLICATOR (WOUND CARE) ×2 IMPLANT
ELECT REM PT RETURN 9FT ADLT (ELECTROSURGICAL) ×2
ELECTRODE REM PT RTRN 9FT ADLT (ELECTROSURGICAL) ×1 IMPLANT
FILTER SMOKE EVAC LAPAROSHD (FILTER) ×2 IMPLANT
FORMALIN 10 PREFIL 120ML (MISCELLANEOUS) ×2 IMPLANT
GLOVE BIO SURGEON STRL SZ7.5 (GLOVE) ×2 IMPLANT
GLOVE BIOGEL PI IND STRL 8 (GLOVE) ×1 IMPLANT
GLOVE BIOGEL PI INDICATOR 8 (GLOVE) ×1
GLOVE ECLIPSE 6.5 STRL STRAW (GLOVE) ×2 IMPLANT
GLOVE ECLIPSE 7.0 STRL STRAW (GLOVE) ×2 IMPLANT
GLOVE INDICATOR 7.0 STRL GRN (GLOVE) ×2 IMPLANT
GLOVE INDICATOR 7.5 STRL GRN (GLOVE) ×2 IMPLANT
GOWN STRL REIN XL XLG (GOWN DISPOSABLE) ×6 IMPLANT
HEMOSTAT SNOW SURGICEL 2X4 (HEMOSTASIS) ×2 IMPLANT
INST SET LAPROSCOPIC AP (KITS) ×2 IMPLANT
IV NS IRRIG 3000ML ARTHROMATIC (IV SOLUTION) IMPLANT
KIT ROOM TURNOVER APOR (KITS) ×2 IMPLANT
MANIFOLD NEPTUNE II (INSTRUMENTS) ×2 IMPLANT
NEEDLE INSUFFLATION 14GA 120MM (NEEDLE) ×2 IMPLANT
NS IRRIG 1000ML POUR BTL (IV SOLUTION) ×2 IMPLANT
PACK LAP CHOLE LZT030E (CUSTOM PROCEDURE TRAY) ×2 IMPLANT
PAD ARMBOARD 7.5X6 YLW CONV (MISCELLANEOUS) ×2 IMPLANT
POUCH SPECIMEN RETRIEVAL 10MM (ENDOMECHANICALS) ×2 IMPLANT
SET BASIN LINEN APH (SET/KITS/TRAYS/PACK) ×2 IMPLANT
SET TUBE IRRIG SUCTION NO TIP (IRRIGATION / IRRIGATOR) IMPLANT
SLEEVE ENDOPATH XCEL 5M (ENDOMECHANICALS) ×2 IMPLANT
SPONGE GAUZE 2X2 8PLY STRL LF (GAUZE/BANDAGES/DRESSINGS) ×8 IMPLANT
STAPLER VISISTAT (STAPLE) ×2 IMPLANT
SUT VICRYL 0 UR6 27IN ABS (SUTURE) ×2 IMPLANT
TAPE CLOTH SURG 4X10 WHT LF (GAUZE/BANDAGES/DRESSINGS) ×2 IMPLANT
TROCAR ENDO BLADELESS 11MM (ENDOMECHANICALS) ×2 IMPLANT
TROCAR XCEL NON-BLD 5MMX100MML (ENDOMECHANICALS) ×2 IMPLANT
TROCAR XCEL UNIV SLVE 11M 100M (ENDOMECHANICALS) ×2 IMPLANT
TUBING INSUFFLATION (TUBING) ×2 IMPLANT
WARMER LAPAROSCOPE (MISCELLANEOUS) ×2 IMPLANT
YANKAUER SUCT 12FT TUBE ARGYLE (SUCTIONS) ×2 IMPLANT

## 2013-08-02 NOTE — Anesthesia Postprocedure Evaluation (Signed)
  Anesthesia Post-op Note  Patient: Karl Aguilar  Procedure(s) Performed: Procedure(s): LAPAROSCOPIC CHOLECYSTECTOMY (N/A)  Patient Location: PACU  Anesthesia Type:General  Level of Consciousness: awake, alert  and oriented  Airway and Oxygen Therapy: Patient Spontanous Breathing and Patient connected to face mask oxygen  Post-op Pain: mild  Post-op Assessment: Post-op Vital signs reviewed, Patient's Cardiovascular Status Stable, Respiratory Function Stable, Patent Airway and No signs of Nausea or vomiting  Post-op Vital Signs: Reviewed and stable  Complications: No apparent anesthesia complications

## 2013-08-02 NOTE — Transfer of Care (Signed)
Immediate Anesthesia Transfer of Care Note  Patient: Karl Aguilar  Procedure(s) Performed: Procedure(s): LAPAROSCOPIC CHOLECYSTECTOMY (N/A)  Patient Location: PACU  Anesthesia Type:General  Level of Consciousness: awake and alert   Airway & Oxygen Therapy: Patient Spontanous Breathing and Patient connected to face mask oxygen  Post-op Assessment: Report given to PACU RN  Post vital signs: Reviewed and stable  Complications: No apparent anesthesia complications

## 2013-08-02 NOTE — Op Note (Signed)
Patient:  Karl Aguilar  DOB:  1970-09-02  MRN:  [id number]   Preop Diagnosis:  Cholecystitis, cholelithiasis  Postop Diagnosis:  Same  Procedure:  Laparoscopic cholecystectomy  Surgeon:  Franky Macho, M.D.  Anes:  General endotracheal  Indications:  Patient is a 42 year old white male presents with cholecystitis secondary to cholelithiasis. The risks and benefits of the procedure including bleeding, infection, hepatobiliary injury, and the possibility of an open procedure were fully explained to the patient, who gave informed consent.  Procedure note:  The patient was placed in the supine position. After induction of general endotracheal anesthesia, the abdomen was prepped and draped using usual sterile technique with DuraPrep. Surgical site confirmation was performed.  A supraumbilical incision was made down to the fascia. A Veress needle was introduced into the abdominal cavity and confirmation of placement was done using the saline drop test. The abdomen was then insufflated to 16 mm mercury pressure. An 11 mm trocar was introduced into the abdominal cavity under direct visualization without difficulty. The patient was placed in reverse Trendelenburg position and additional 11 mm trocar was placed the epigastric region and 5 mm trochars were placed the right upper quadrant and right flank regions. The liver was inspected and noted within normal limits. The gallbladder was retracted in a dynamic fashion in order to expose the triangle of Calot. The cystic duct was first identified. Its juncture to the infundibulum was fully identified. Endoclips were placed proximally distally on the cystic duct, and the cystic duct was divided. This was likewise done cystic artery. The gallbladder was then freed away from gallbladder fossa using Bovie electrocautery. The gallbladder was delivered through the epigastric trocar site using an Endo Catch bag. The gallbladder fossa was inspected and no abnormal  bleeding or bile leakage was noted. Surgicel is placed the gallbladder fossa. All fluid and air were then evacuated from the abdominal cavity prior to removal of the trochars.  All wounds were irrigated with normal saline. All wounds were checked with 0.5% Sensorcaine. The supraumbilical fascia as well as epigastric fascia reapproximated using 0 Vicryl interrupted sutures. All skin incisions were closed using staples. Betadine ointment and dry sterile dressings were applied.  All tape and needle counts were correct the end of the procedure. Patient was extubated in the operating room and transferred to PACU in stable condition.  Complications:  None  EBL:  Minimal  Specimen:  Gallbladder

## 2013-08-02 NOTE — Anesthesia Procedure Notes (Signed)
Procedure Name: Intubation Date/Time: 08/02/2013 9:13 AM Performed by: Glynn Octave E Pre-anesthesia Checklist: Patient identified, Patient being monitored, Timeout performed, Emergency Drugs available and Suction available Patient Re-evaluated:Patient Re-evaluated prior to inductionOxygen Delivery Method: Circle System Utilized Preoxygenation: Pre-oxygenation with 100% oxygen Intubation Type: IV induction, Rapid sequence and Cricoid Pressure applied Ventilation: Mask ventilation without difficulty Laryngoscope Size: Mac and 3 Grade View: Grade I Tube type: Oral Tube size: 7.0 mm Number of attempts: 1 Airway Equipment and Method: stylet Placement Confirmation: ETT inserted through vocal cords under direct vision,  positive ETCO2 and breath sounds checked- equal and bilateral Secured at: 21 cm Tube secured with: Tape Dental Injury: Teeth and Oropharynx as per pre-operative assessment

## 2013-08-02 NOTE — Anesthesia Preprocedure Evaluation (Signed)
Anesthesia Evaluation  Patient identified by MRN, date of birth, ID band Patient awake    Reviewed: Allergy & Precautions, H&P , NPO status , Patient's Chart, lab work & pertinent test results  Airway Mallampati: II TM Distance: >3 FB Neck ROM: Full    Dental  (+) Teeth Intact   Pulmonary Current Smoker (am cough),  breath sounds clear to auscultation        Cardiovascular negative cardio ROS  Rhythm:Regular Rate:Normal     Neuro/Psych    GI/Hepatic GERD-  Controlled,  Endo/Other    Renal/GU      Musculoskeletal   Abdominal   Peds  Hematology   Anesthesia Other Findings   Reproductive/Obstetrics                           Anesthesia Physical Anesthesia Plan  ASA: II  Anesthesia Plan: General   Post-op Pain Management:    Induction: Intravenous, Rapid sequence and Cricoid pressure planned  Airway Management Planned: Oral ETT  Additional Equipment:   Intra-op Plan:   Post-operative Plan: Extubation in OR  Informed Consent: I have reviewed the patients History and Physical, chart, labs and discussed the procedure including the risks, benefits and alternatives for the proposed anesthesia with the patient or authorized representative who has indicated his/her understanding and acceptance.     Plan Discussed with:   Anesthesia Plan Comments:         Anesthesia Quick Evaluation

## 2013-08-02 NOTE — Interval H&P Note (Signed)
History and Physical Interval Note:  08/02/2013 8:14 AM  Karl Aguilar  has presented today for surgery, with the diagnosis of cholelithiasis  The various methods of treatment have been discussed with the patient and family. After consideration of risks, benefits and other options for treatment, the patient has consented to  Procedure(s): LAPAROSCOPIC CHOLECYSTECTOMY (N/A) as a surgical intervention .  The patient's history has been reviewed, patient examined, no change in status, stable for surgery.  I have reviewed the patient's chart and labs.  Questions were answered to the patient's satisfaction.     Franky Macho A

## 2013-08-05 ENCOUNTER — Encounter (HOSPITAL_COMMUNITY): Payer: Self-pay | Admitting: General Surgery

## 2014-01-16 IMAGING — US US ABDOMEN LIMITED
1 series · 14 of 25 positions shown · non-contrast
Comparison: None.

CLINICAL DATA: Right upper quadrant abdominal pain

EXAM:
US ABDOMEN LIMITED - RIGHT UPPER QUADRANT

[Series 1: us abdomen limited · 0.24mm/px · 14 of 43 slices shown]
[im 1/43]
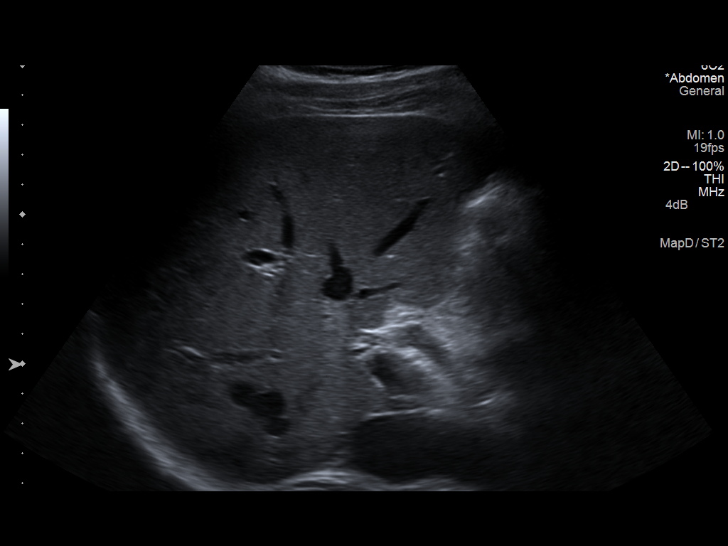
[im 4/43]
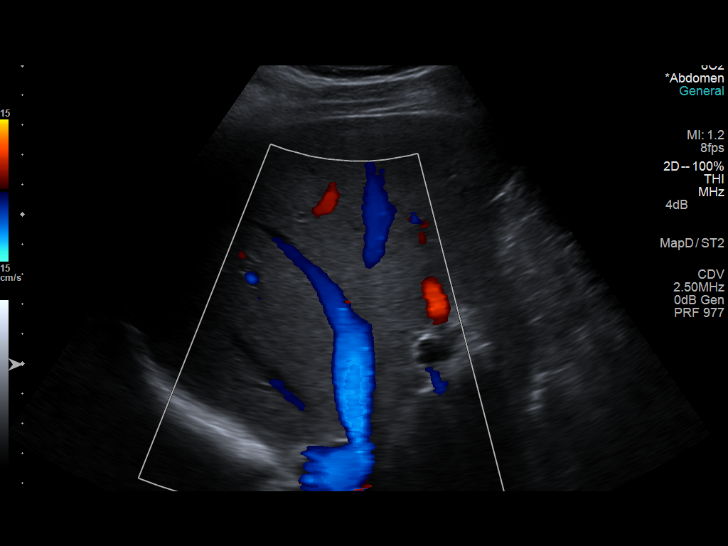
[im 8/43]
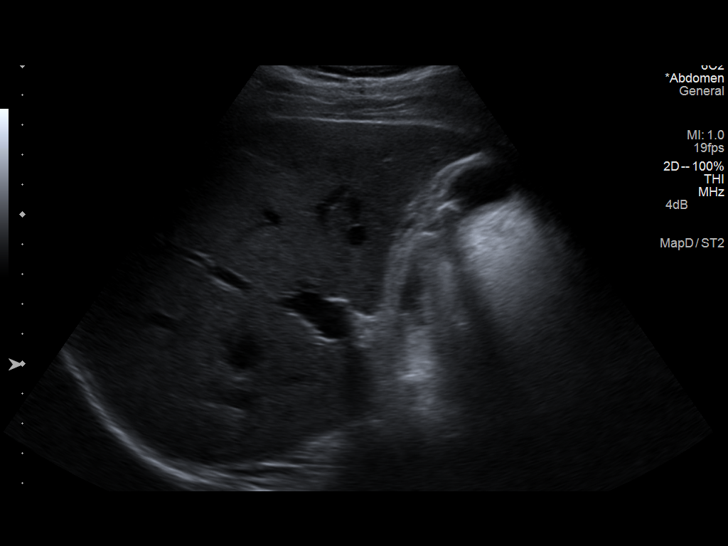
[im 11/43]
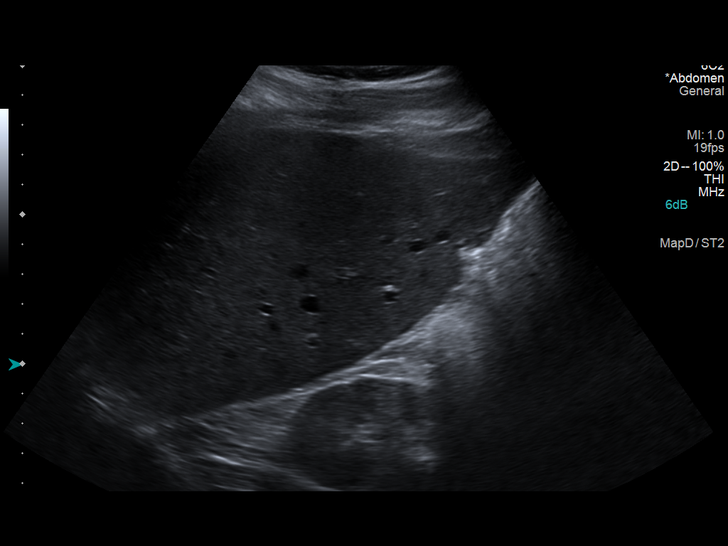
[im 15/43]
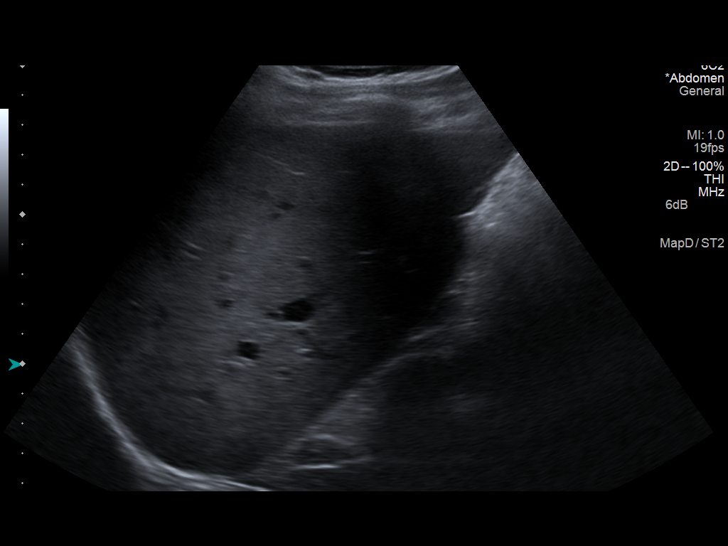
[im 16/43]
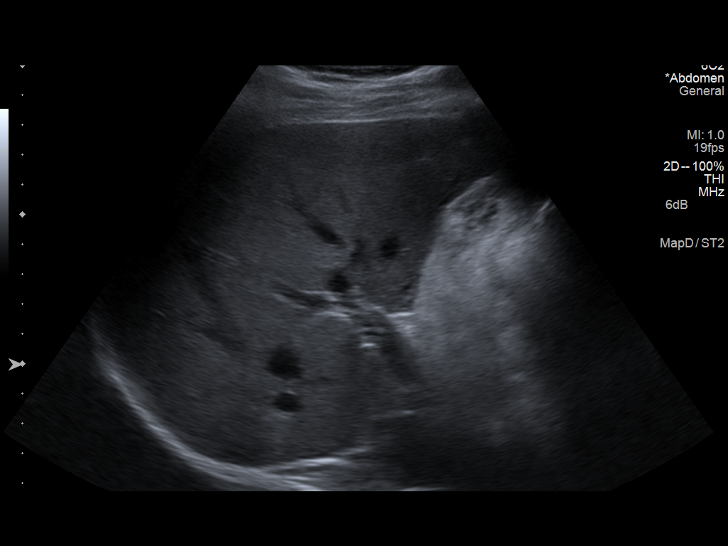
[im 20/43]
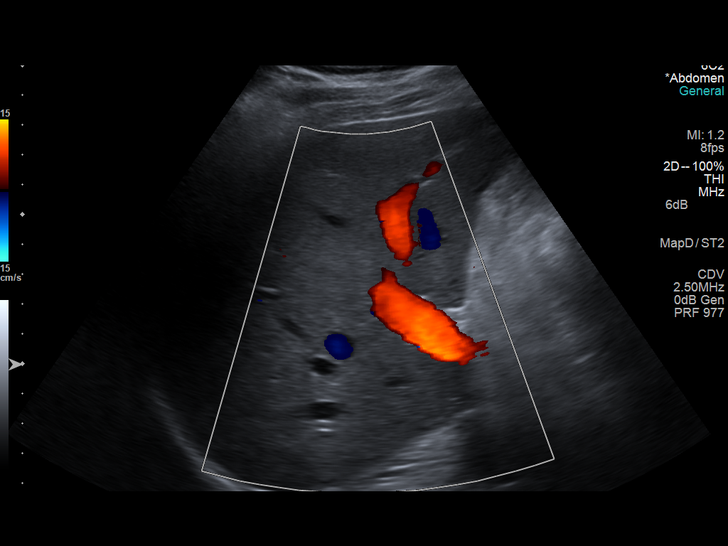
[im 23/43]
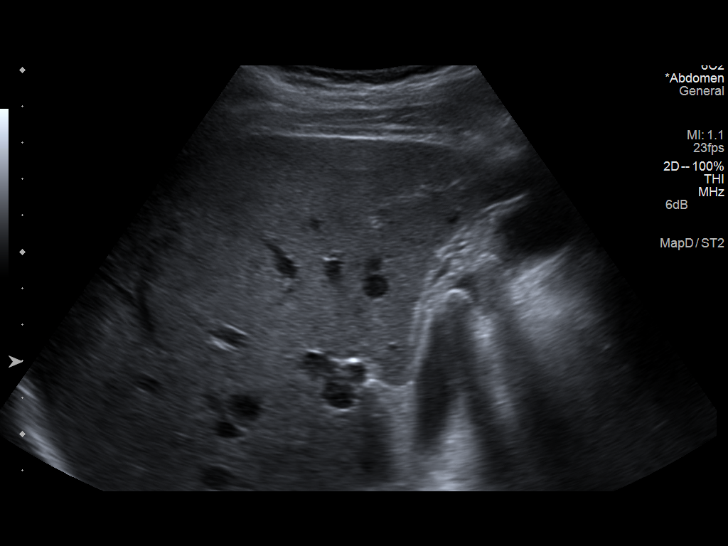
[im 27/43]
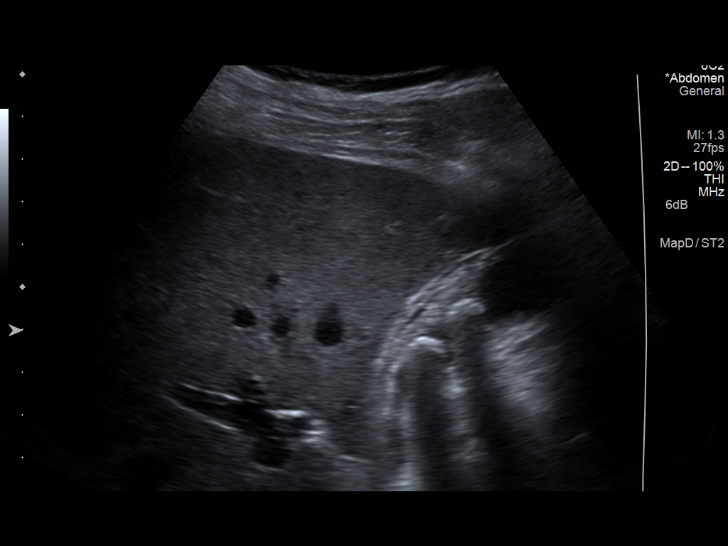
[im 29/43]
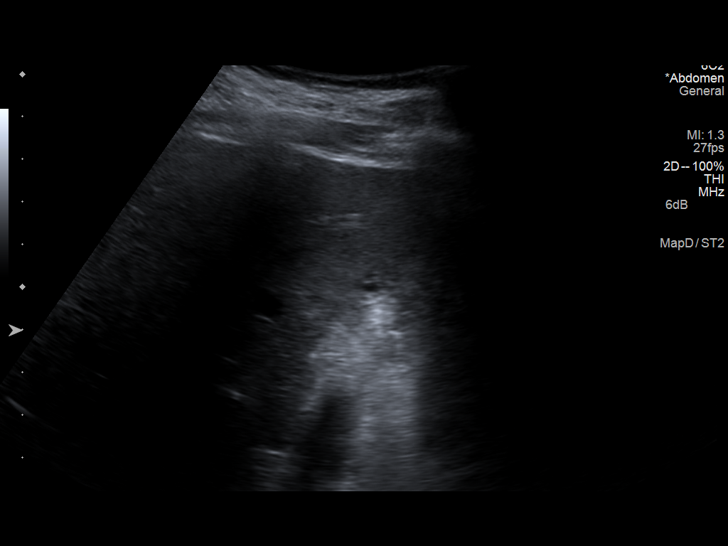
[im 32/43]
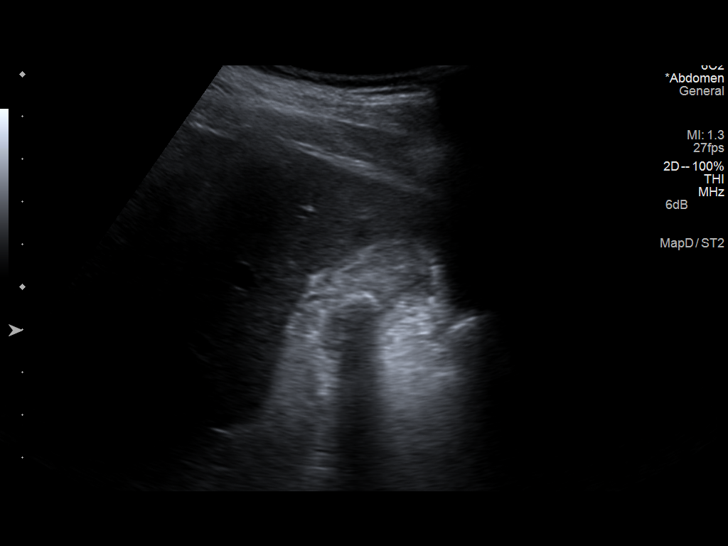
[im 36/43]
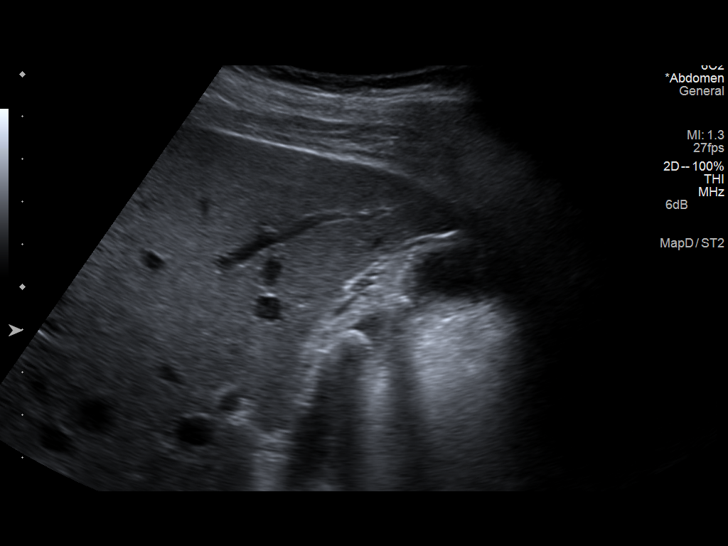
[im 39/43]
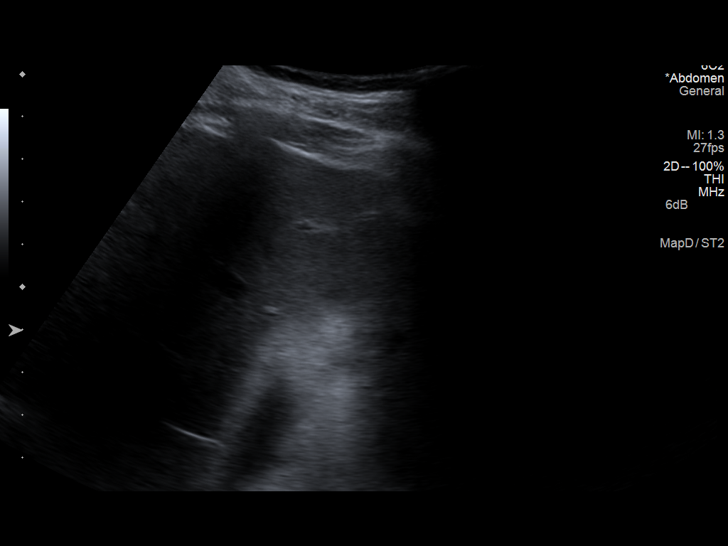
[im 43/43]
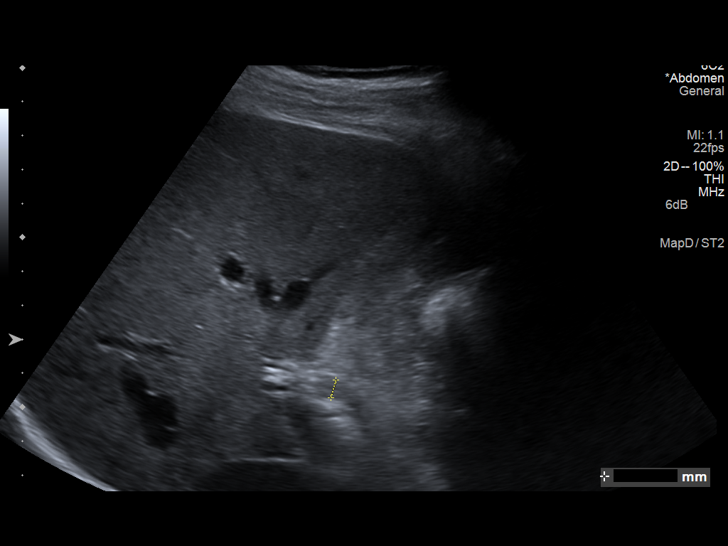

[14 of 25 positions shown; findings below may reference images not displayed]

FINDINGS: Gallbladder

Cholelithiasis with gallbladder wall thickening/edema, measuring up
to 9 mm. Gallbladder is not distended. Negative sonographic Murphy
sign.

Common bile duct

Diameter: 5 mm.

Liver:

No focal lesion identified. Within normal limits in parenchymal
echogenicity.
IMPRESSION: Cholelithiasis with gallbladder wall thickening/edema. Negative
sonographic Murphy's sign.

This appearance remains equivocal, but early acute cholecystitis is
not excluded. Consider hepatobiliary nuclear medicine scan for
further evaluation.

## 2014-01-16 IMAGING — CR DG CHEST 1V PORT
1 series · 1 of 1 positions shown · non-contrast
Comparison: None available

CLINICAL DATA: Chest pain

EXAM:
PORTABLE CHEST - 1 VIEW

[portable]
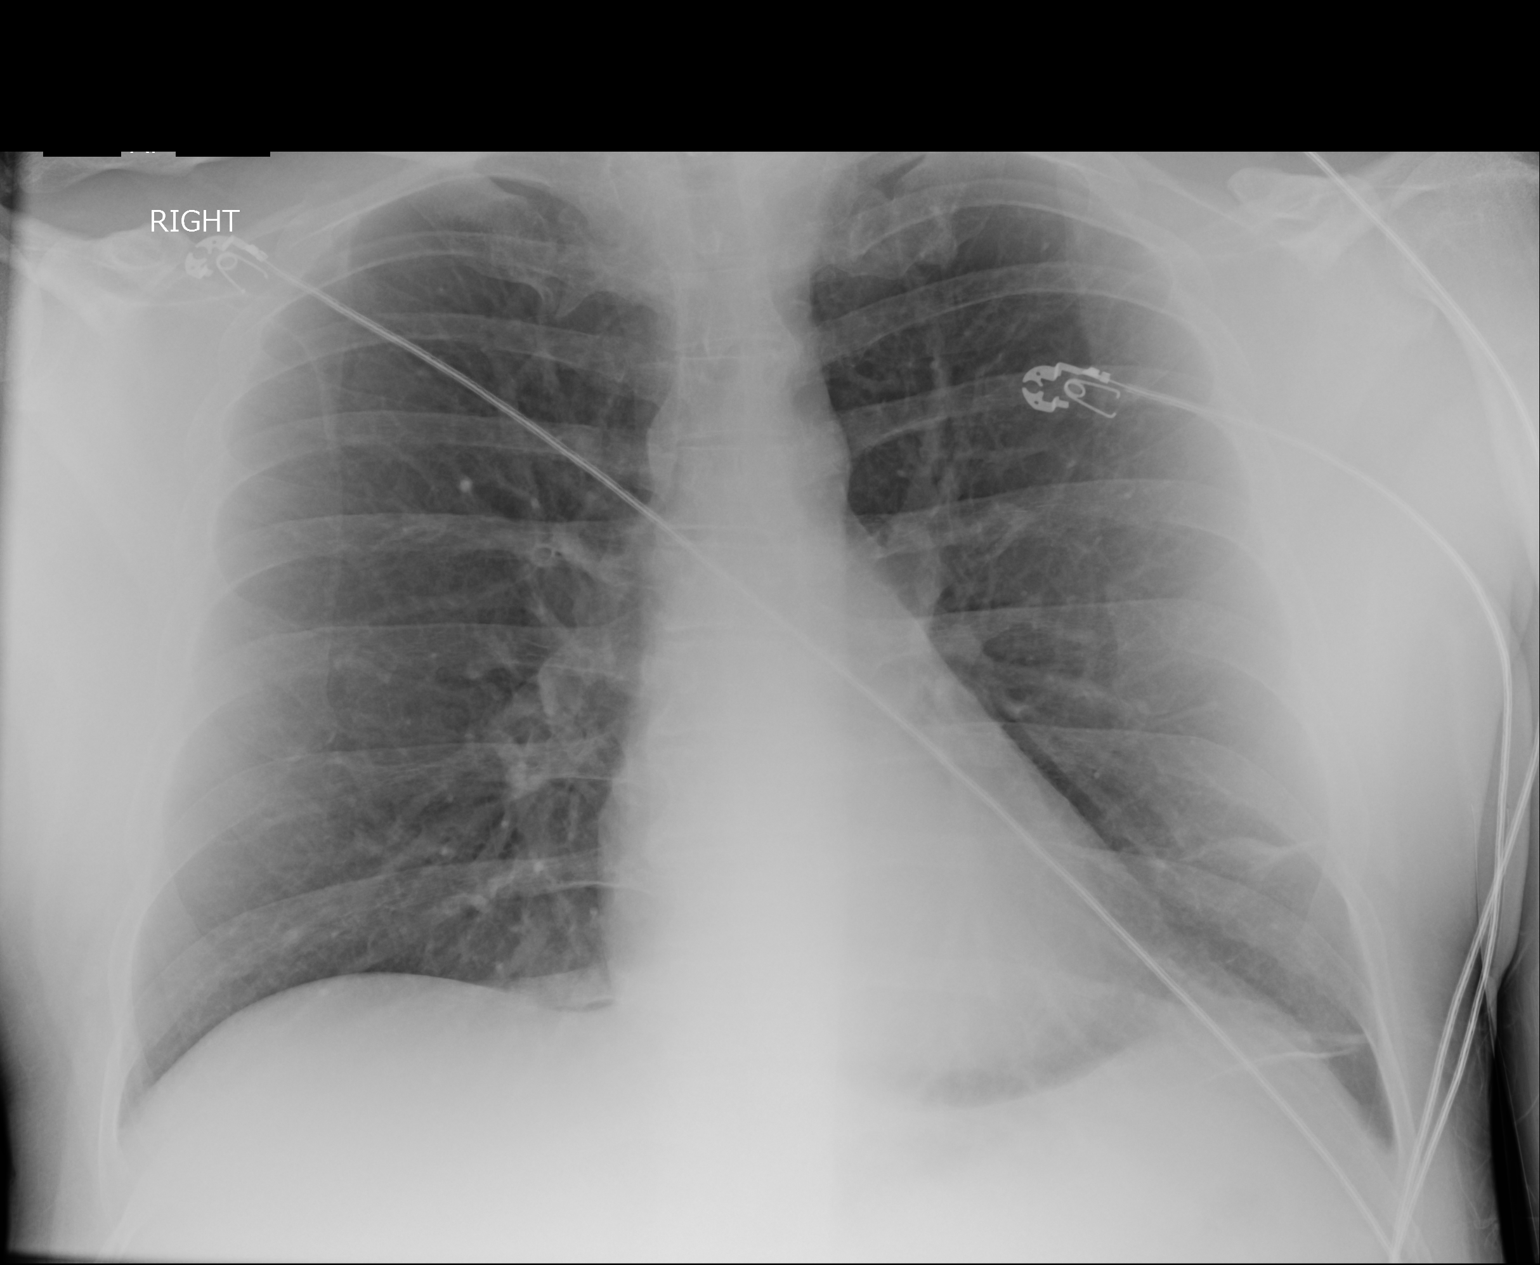

[1 of 1 positions shown; findings below may reference images not displayed]

FINDINGS: Linear scarring or subsegmental atelectasis at the left lung base.
Right lung clear. Heart size upper limits normal. No effusion.
Regional bones unremarkable.
IMPRESSION: Minimal atelectasis or scarring at the left lung base

## 2015-03-03 ENCOUNTER — Other Ambulatory Visit: Payer: Self-pay | Admitting: Sports Medicine

## 2015-03-03 DIAGNOSIS — M25551 Pain in right hip: Secondary | ICD-10-CM

## 2015-03-03 DIAGNOSIS — M545 Low back pain, unspecified: Secondary | ICD-10-CM

## 2015-03-13 ENCOUNTER — Ambulatory Visit
Admission: RE | Admit: 2015-03-13 | Discharge: 2015-03-13 | Disposition: A | Discharge status: Home / Self Care | Payer: BLUE CROSS/BLUE SHIELD | Source: Ambulatory Visit | Attending: Sports Medicine | Admitting: Sports Medicine

## 2015-03-13 DIAGNOSIS — M25551 Pain in right hip: Secondary | ICD-10-CM

## 2015-03-13 DIAGNOSIS — M545 Low back pain, unspecified: Secondary | ICD-10-CM

## 2015-03-17 ENCOUNTER — Other Ambulatory Visit (HOSPITAL_COMMUNITY): Payer: Self-pay | Admitting: Sports Medicine

## 2015-03-17 DIAGNOSIS — M25551 Pain in right hip: Secondary | ICD-10-CM

## 2015-03-17 DIAGNOSIS — M545 Low back pain, unspecified: Secondary | ICD-10-CM

## 2015-04-02 ENCOUNTER — Other Ambulatory Visit (HOSPITAL_COMMUNITY): Payer: Self-pay

## 2015-04-07 ENCOUNTER — Encounter (HOSPITAL_COMMUNITY): Admission: RE | Payer: Self-pay | Source: Ambulatory Visit

## 2015-04-07 ENCOUNTER — Ambulatory Visit (HOSPITAL_COMMUNITY)
Admission: RE | Admit: 2015-04-07 | Payer: BLUE CROSS/BLUE SHIELD | Source: Ambulatory Visit | Admitting: Sports Medicine

## 2015-04-07 ENCOUNTER — Ambulatory Visit (HOSPITAL_COMMUNITY): Payer: BLUE CROSS/BLUE SHIELD

## 2015-04-07 SURGERY — RADIOLOGY WITH ANESTHESIA
Anesthesia: General

## 2019-09-02 DIAGNOSIS — M7110 Other infective bursitis, unspecified site: Secondary | ICD-10-CM | POA: Diagnosis not present

## 2019-09-02 DIAGNOSIS — M7042 Prepatellar bursitis, left knee: Secondary | ICD-10-CM | POA: Diagnosis not present

## 2019-10-01 DIAGNOSIS — M545 Low back pain: Secondary | ICD-10-CM | POA: Diagnosis not present

## 2019-10-08 DIAGNOSIS — M545 Low back pain: Secondary | ICD-10-CM | POA: Diagnosis not present

## 2019-12-03 DIAGNOSIS — M545 Low back pain: Secondary | ICD-10-CM | POA: Diagnosis not present

## 2019-12-11 ENCOUNTER — Other Ambulatory Visit (HOSPITAL_COMMUNITY): Payer: Self-pay | Admitting: Sports Medicine

## 2019-12-11 DIAGNOSIS — M545 Low back pain, unspecified: Secondary | ICD-10-CM

## 2019-12-12 DIAGNOSIS — M5136 Other intervertebral disc degeneration, lumbar region: Secondary | ICD-10-CM | POA: Diagnosis not present

## 2019-12-12 DIAGNOSIS — Z6825 Body mass index (BMI) 25.0-25.9, adult: Secondary | ICD-10-CM | POA: Diagnosis not present

## 2019-12-12 DIAGNOSIS — E663 Overweight: Secondary | ICD-10-CM | POA: Diagnosis not present

## 2019-12-12 DIAGNOSIS — F40240 Claustrophobia: Secondary | ICD-10-CM | POA: Diagnosis not present

## 2019-12-12 DIAGNOSIS — Z1389 Encounter for screening for other disorder: Secondary | ICD-10-CM | POA: Diagnosis not present

## 2019-12-20 ENCOUNTER — Encounter (HOSPITAL_COMMUNITY): Payer: Self-pay | Admitting: *Deleted

## 2019-12-20 ENCOUNTER — Other Ambulatory Visit: Payer: Self-pay

## 2019-12-20 NOTE — Progress Notes (Signed)
Spoke with pt for pre-op call. Pt denies hx of HTN, cardiac disease or Diabetes.  Covid test scheduled for 12/21/19. Pt instructed that he needs to quarantine once the test is done and to stay in quarantine until he comes to the hospital on Tuesday. He voiced understanding.

## 2019-12-21 ENCOUNTER — Other Ambulatory Visit (HOSPITAL_COMMUNITY)
Admission: RE | Admit: 2019-12-21 | Discharge: 2019-12-21 | Disposition: A | Discharge status: Home / Self Care | Payer: BC Managed Care – PPO | Source: Ambulatory Visit | Attending: Sports Medicine | Admitting: Sports Medicine

## 2019-12-21 DIAGNOSIS — Z20822 Contact with and (suspected) exposure to covid-19: Secondary | ICD-10-CM | POA: Diagnosis not present

## 2019-12-21 DIAGNOSIS — Z01812 Encounter for preprocedural laboratory examination: Secondary | ICD-10-CM | POA: Diagnosis not present

## 2019-12-22 LAB — SARS CORONAVIRUS 2 (TAT 6-24 HRS): SARS Coronavirus 2: NEGATIVE

## 2019-12-24 ENCOUNTER — Ambulatory Visit (HOSPITAL_COMMUNITY)
Admission: RE | Admit: 2019-12-24 | Discharge: 2019-12-24 | Disposition: A | Discharge status: Home / Self Care | Payer: BC Managed Care – PPO | Source: Ambulatory Visit | Attending: Sports Medicine | Admitting: Sports Medicine

## 2019-12-24 ENCOUNTER — Encounter (HOSPITAL_COMMUNITY)
Admission: RE | Disposition: A | Discharge status: Home / Self Care | Payer: Self-pay | Source: Ambulatory Visit | Attending: Sports Medicine

## 2019-12-24 ENCOUNTER — Encounter (HOSPITAL_COMMUNITY): Payer: Self-pay | Admitting: Certified Registered Nurse Anesthetist

## 2019-12-24 ENCOUNTER — Ambulatory Visit (HOSPITAL_COMMUNITY): Payer: BC Managed Care – PPO | Admitting: Certified Registered Nurse Anesthetist

## 2019-12-24 ENCOUNTER — Other Ambulatory Visit: Payer: Self-pay

## 2019-12-24 DIAGNOSIS — Z791 Long term (current) use of non-steroidal anti-inflammatories (NSAID): Secondary | ICD-10-CM | POA: Diagnosis not present

## 2019-12-24 DIAGNOSIS — M545 Low back pain, unspecified: Secondary | ICD-10-CM

## 2019-12-24 DIAGNOSIS — M4626 Osteomyelitis of vertebra, lumbar region: Secondary | ICD-10-CM | POA: Diagnosis not present

## 2019-12-24 DIAGNOSIS — Z886 Allergy status to analgesic agent status: Secondary | ICD-10-CM | POA: Diagnosis not present

## 2019-12-24 DIAGNOSIS — Z7952 Long term (current) use of systemic steroids: Secondary | ICD-10-CM | POA: Diagnosis not present

## 2019-12-24 DIAGNOSIS — M5127 Other intervertebral disc displacement, lumbosacral region: Secondary | ICD-10-CM | POA: Insufficient documentation

## 2019-12-24 DIAGNOSIS — G8929 Other chronic pain: Secondary | ICD-10-CM | POA: Diagnosis not present

## 2019-12-24 DIAGNOSIS — F17210 Nicotine dependence, cigarettes, uncomplicated: Secondary | ICD-10-CM | POA: Diagnosis not present

## 2019-12-24 DIAGNOSIS — F17200 Nicotine dependence, unspecified, uncomplicated: Secondary | ICD-10-CM | POA: Diagnosis not present

## 2019-12-24 DIAGNOSIS — M4646 Discitis, unspecified, lumbar region: Secondary | ICD-10-CM | POA: Diagnosis not present

## 2019-12-24 DIAGNOSIS — M48061 Spinal stenosis, lumbar region without neurogenic claudication: Secondary | ICD-10-CM | POA: Insufficient documentation

## 2019-12-24 DIAGNOSIS — Z03818 Encounter for observation for suspected exposure to other biological agents ruled out: Secondary | ICD-10-CM | POA: Diagnosis not present

## 2019-12-24 DIAGNOSIS — Z20822 Contact with and (suspected) exposure to covid-19: Secondary | ICD-10-CM | POA: Diagnosis not present

## 2019-12-24 DIAGNOSIS — E1169 Type 2 diabetes mellitus with other specified complication: Secondary | ICD-10-CM | POA: Diagnosis not present

## 2019-12-24 HISTORY — DX: Personal history of urinary calculi: Z87.442

## 2019-12-24 HISTORY — PX: RADIOLOGY WITH ANESTHESIA: SHX6223

## 2019-12-24 LAB — BASIC METABOLIC PANEL
Anion gap: 10 (ref 5–15)
BUN: 10 mg/dL (ref 6–20)
CO2: 22 mmol/L (ref 22–32)
Calcium: 9.6 mg/dL (ref 8.9–10.3)
Chloride: 104 mmol/L (ref 98–111)
Creatinine, Ser: 1.08 mg/dL (ref 0.61–1.24)
GFR calc Af Amer: >60 mL/min (ref >60)
GFR calc non Af Amer: >60 mL/min (ref >60)
Glucose, Bld: 117 mg/dL — ABNORMAL HIGH (ref 70–99)
Potassium: 3.9 mmol/L (ref 3.5–5.1)
Sodium: 136 mmol/L (ref 135–145)

## 2019-12-24 SURGERY — MRI WITH ANESTHESIA
Anesthesia: General

## 2019-12-24 MED ORDER — LIDOCAINE 2% (20 MG/ML) 5 ML SYRINGE
INTRAMUSCULAR | Status: DC | PRN
  Administered 2019-12-24: 100 mg via INTRAVENOUS

## 2019-12-24 MED ORDER — LACTATED RINGERS IV SOLN: INTRAVENOUS | Status: DC

## 2019-12-24 MED ORDER — TRAMADOL HCL 50 MG PO TABS
ORAL_TABLET | ORAL | Status: AC
  Filled 2019-12-24: qty 1

## 2019-12-24 MED ORDER — PROPOFOL 10 MG/ML IV BOLUS
INTRAVENOUS | Status: DC | PRN
  Administered 2019-12-24: 200 mg via INTRAVENOUS

## 2019-12-24 MED ORDER — TRAMADOL HCL 50 MG PO TABS
50.0000 mg | ORAL_TABLET | Freq: Once | ORAL | Status: AC
  Administered 2019-12-24: 50 mg via ORAL

## 2019-12-24 MED ORDER — ONDANSETRON HCL 4 MG/2ML IJ SOLN
INTRAMUSCULAR | Status: DC | PRN
  Administered 2019-12-24: 4 mg via INTRAVENOUS

## 2019-12-24 MED ORDER — HYDROMORPHONE HCL 1 MG/ML IJ SOLN
INTRAMUSCULAR | Status: AC
  Filled 2019-12-24: qty 1

## 2019-12-24 MED ORDER — MIDAZOLAM HCL 5 MG/5ML IJ SOLN
INTRAMUSCULAR | Status: DC | PRN
  Administered 2019-12-24 (×2): 1 mg via INTRAVENOUS

## 2019-12-24 MED ORDER — DEXAMETHASONE SODIUM PHOSPHATE 10 MG/ML IJ SOLN
INTRAMUSCULAR | Status: DC | PRN
  Administered 2019-12-24: 8 mg via INTRAVENOUS

## 2019-12-24 MED ORDER — HYDROMORPHONE HCL 1 MG/ML IJ SOLN
0.2500 mg | INTRAMUSCULAR | Status: DC | PRN
  Administered 2019-12-24 (×2): 0.5 mg via INTRAVENOUS

## 2019-12-24 MED ORDER — FENTANYL CITRATE (PF) 100 MCG/2ML IJ SOLN
INTRAMUSCULAR | Status: DC | PRN
  Administered 2019-12-24 (×8): 25 ug via INTRAVENOUS

## 2019-12-24 MED ORDER — PROMETHAZINE HCL 25 MG/ML IJ SOLN: 6.2500 mg | INTRAMUSCULAR | Status: DC | PRN

## 2019-12-24 NOTE — Transfer of Care (Signed)
Immediate Anesthesia Transfer of Care Note  Patient: Karl Aguilar  Procedure(s) Performed: MRI LUMBER SPINE WITHOUT CONTRAST (N/A )  Patient Location: PACU  Anesthesia Type:General  Level of Consciousness: awake, alert , oriented, patient cooperative and responds to stimulation  Airway & Oxygen Therapy: Patient Spontanous Breathing  Post-op Assessment: Report given to RN, Post -op Vital signs reviewed and stable and Patient moving all extremities X 4  Post vital signs: Reviewed and stable  Last Vitals:  Vitals Value Taken Time  BP 129/90 12/24/19 1111  Temp    Pulse 99 12/24/19 1112  Resp 33 12/24/19 1112  SpO2 98 % 12/24/19 1112  Vitals shown include unvalidated device data.  Last Pain:  Vitals:   12/24/19 0907  TempSrc:   PainSc: 9          Complications: No apparent anesthesia complications

## 2019-12-24 NOTE — Progress Notes (Signed)
Dr. Mal Amabile notified of patient's HR.  Received verbal order for EKG.

## 2019-12-24 NOTE — Anesthesia Preprocedure Evaluation (Addendum)
Anesthesia Evaluation  Patient identified by MRN, date of birth, ID band Patient awake    Reviewed: Allergy & Precautions, NPO status , Patient's Chart, lab work & pertinent test results  History of Anesthesia Complications Negative for: history of anesthetic complications  Airway Mallampati: I  TM Distance: >3 FB Neck ROM: Full    Dental  (+) Dental Advisory Given, Chipped   Pulmonary Current SmokerPatient did not abstain from smoking.,    Pulmonary exam normal        Cardiovascular negative cardio ROS   Rhythm:Regular Rate:Tachycardia     Neuro/Psych negative neurological ROS  negative psych ROS   GI/Hepatic negative GI ROS, Neg liver ROS,   Endo/Other  negative endocrine ROS  Renal/GU negative Renal ROS     Musculoskeletal negative musculoskeletal ROS (+)   Abdominal   Peds  Hematology negative hematology ROS (+)   Anesthesia Other Findings Covid neg 4/24   Reproductive/Obstetrics                           Anesthesia Physical Anesthesia Plan  ASA: II  Anesthesia Plan: General   Post-op Pain Management:    Induction: Intravenous  PONV Risk Score and Plan: 2 and Treatment may vary due to age or medical condition, Ondansetron and Midazolam  Airway Management Planned: LMA  Additional Equipment: None  Intra-op Plan:   Post-operative Plan: Extubation in OR  Informed Consent: I have reviewed the patients History and Physical, chart, labs and discussed the procedure including the risks, benefits and alternatives for the proposed anesthesia with the patient or authorized representative who has indicated his/her understanding and acceptance.     Dental advisory given  Plan Discussed with: CRNA and Anesthesiologist  Anesthesia Plan Comments:        Anesthesia Quick Evaluation

## 2019-12-25 NOTE — Anesthesia Postprocedure Evaluation (Addendum)
Anesthesia Post Note  Patient: Karl Aguilar  Procedure(s) Performed: MRI LUMBER SPINE WITHOUT CONTRAST (N/A )     Patient location during evaluation: PACU Anesthesia Type: General Level of consciousness: awake and alert Pain management: satisfactory to patient Vital Signs Assessment: post-procedure vital signs reviewed and stable Respiratory status: spontaneous breathing, nonlabored ventilation and respiratory function stable Cardiovascular status: blood pressure returned to baseline, stable and tachycardic Postop Assessment: no apparent nausea or vomiting Anesthetic complications: no    Last Vitals:  Vitals:   12/24/19 1130 12/24/19 1145  BP: (!) 125/91 129/82  Pulse: (!) 118 (!) 123  Resp: 18 (!) 22  Temp:  36.6 C  SpO2: 93% 94%    Last Pain:  Vitals:   12/24/19 1145  TempSrc:   PainSc: 8                  Beryle Lathe

## 2019-12-26 ENCOUNTER — Inpatient Hospital Stay (HOSPITAL_COMMUNITY)
Admission: EM | Admit: 2019-12-26 | Discharge: 2019-12-29 | DRG: 541 | Disposition: A | Discharge status: Home Health | Payer: BC Managed Care – PPO | Attending: Internal Medicine | Admitting: Internal Medicine

## 2019-12-26 ENCOUNTER — Emergency Department (HOSPITAL_COMMUNITY): Payer: BC Managed Care – PPO

## 2019-12-26 ENCOUNTER — Encounter (HOSPITAL_COMMUNITY): Payer: Self-pay | Admitting: Pediatrics

## 2019-12-26 ENCOUNTER — Other Ambulatory Visit: Payer: Self-pay

## 2019-12-26 DIAGNOSIS — M4626 Osteomyelitis of vertebra, lumbar region: Secondary | ICD-10-CM | POA: Diagnosis not present

## 2019-12-26 DIAGNOSIS — Z03818 Encounter for observation for suspected exposure to other biological agents ruled out: Secondary | ICD-10-CM | POA: Diagnosis not present

## 2019-12-26 DIAGNOSIS — Z886 Allergy status to analgesic agent status: Secondary | ICD-10-CM | POA: Diagnosis not present

## 2019-12-26 DIAGNOSIS — Z791 Long term (current) use of non-steroidal anti-inflammatories (NSAID): Secondary | ICD-10-CM | POA: Diagnosis not present

## 2019-12-26 DIAGNOSIS — G8929 Other chronic pain: Secondary | ICD-10-CM | POA: Diagnosis present

## 2019-12-26 DIAGNOSIS — E1169 Type 2 diabetes mellitus with other specified complication: Secondary | ICD-10-CM | POA: Diagnosis present

## 2019-12-26 DIAGNOSIS — Z7952 Long term (current) use of systemic steroids: Secondary | ICD-10-CM | POA: Diagnosis not present

## 2019-12-26 DIAGNOSIS — F17200 Nicotine dependence, unspecified, uncomplicated: Secondary | ICD-10-CM | POA: Diagnosis present

## 2019-12-26 DIAGNOSIS — Z20822 Contact with and (suspected) exposure to covid-19: Secondary | ICD-10-CM | POA: Diagnosis present

## 2019-12-26 DIAGNOSIS — M4646 Discitis, unspecified, lumbar region: Principal | ICD-10-CM | POA: Diagnosis present

## 2019-12-26 DIAGNOSIS — M545 Low back pain: Secondary | ICD-10-CM | POA: Diagnosis not present

## 2019-12-26 DIAGNOSIS — F17210 Nicotine dependence, cigarettes, uncomplicated: Secondary | ICD-10-CM | POA: Diagnosis present

## 2019-12-26 LAB — RESPIRATORY PANEL BY RT PCR (FLU A&B, COVID)
Influenza A by PCR: NEGATIVE
Influenza B by PCR: NEGATIVE
SARS Coronavirus 2 by RT PCR: NEGATIVE

## 2019-12-26 LAB — URINALYSIS, ROUTINE W REFLEX MICROSCOPIC
Bilirubin Urine: NEGATIVE
Glucose, UA: NEGATIVE mg/dL
Ketones, ur: NEGATIVE mg/dL
Nitrite: POSITIVE — AB
Protein, ur: NEGATIVE mg/dL
Specific Gravity, Urine: 1.012 (ref 1.005–1.030)
pH: 5 (ref 5.0–8.0)

## 2019-12-26 LAB — COMPREHENSIVE METABOLIC PANEL
ALT: 35 U/L (ref 0–44)
AST: 16 U/L (ref 15–41)
Albumin: 3.2 g/dL — ABNORMAL LOW (ref 3.5–5.0)
Alkaline Phosphatase: 115 U/L (ref 38–126)
Anion gap: 16 — ABNORMAL HIGH (ref 5–15)
BUN: 10 mg/dL (ref 6–20)
CO2: 21 mmol/L — ABNORMAL LOW (ref 22–32)
Calcium: 9.6 mg/dL (ref 8.9–10.3)
Chloride: 100 mmol/L (ref 98–111)
Creatinine, Ser: 1.11 mg/dL (ref 0.61–1.24)
GFR calc Af Amer: >60 mL/min (ref >60)
GFR calc non Af Amer: >60 mL/min (ref >60)
Glucose, Bld: 119 mg/dL — ABNORMAL HIGH (ref 70–99)
Potassium: 3.7 mmol/L (ref 3.5–5.1)
Sodium: 137 mmol/L (ref 135–145)
Total Bilirubin: 0.7 mg/dL (ref 0.3–1.2)
Total Protein: 7.3 g/dL (ref 6.5–8.1)

## 2019-12-26 LAB — CBC WITH DIFFERENTIAL/PLATELET
Abs Immature Granulocytes: 0.05 10*3/uL (ref 0.00–0.07)
Basophils Absolute: 0.1 10*3/uL (ref 0.0–0.1)
Basophils Relative: 1 %
Eosinophils Absolute: 0.3 10*3/uL (ref 0.0–0.5)
Eosinophils Relative: 4 %
HCT: 38.2 % — ABNORMAL LOW (ref 39.0–52.0)
Hemoglobin: 11.8 g/dL — ABNORMAL LOW (ref 13.0–17.0)
Immature Granulocytes: 1 %
Lymphocytes Relative: 27 %
Lymphs Abs: 2.4 10*3/uL (ref 0.7–4.0)
MCH: 27.6 pg (ref 26.0–34.0)
MCHC: 30.9 g/dL (ref 30.0–36.0)
MCV: 89.3 fL (ref 80.0–100.0)
Monocytes Absolute: 0.8 10*3/uL (ref 0.1–1.0)
Monocytes Relative: 9 %
Neutro Abs: 5.4 10*3/uL (ref 1.7–7.7)
Neutrophils Relative %: 58 %
Platelets: 333 10*3/uL (ref 150–400)
RBC: 4.28 MIL/uL (ref 4.22–5.81)
RDW: 14.7 % (ref 11.5–15.5)
WBC: 9 10*3/uL (ref 4.0–10.5)
nRBC: 0 % (ref 0.0–0.2)

## 2019-12-26 LAB — C-REACTIVE PROTEIN: CRP: 7.1 mg/dL — ABNORMAL HIGH (ref <1.0)

## 2019-12-26 LAB — HEMOGLOBIN A1C
Hgb A1c MFr Bld: 7.1 % — ABNORMAL HIGH (ref 4.8–5.6)
Mean Plasma Glucose: 157.07 mg/dL

## 2019-12-26 LAB — LACTIC ACID, PLASMA
Lactic Acid, Venous: 2.7 mmol/L (ref 0.5–1.9)
Lactic Acid, Venous: 1.3 mmol/L (ref 0.5–1.9)

## 2019-12-26 LAB — SEDIMENTATION RATE: Sed Rate: 80 mm/hr — ABNORMAL HIGH (ref 0–16)

## 2019-12-26 LAB — PROTIME-INR
INR: 1 (ref 0.8–1.2)
Prothrombin Time: 13 seconds (ref 11.4–15.2)

## 2019-12-26 MED ORDER — TRAMADOL HCL 50 MG PO TABS: 50.0000 mg | ORAL_TABLET | ORAL | Status: DC | PRN

## 2019-12-26 MED ORDER — IBUPROFEN 200 MG PO TABS: 600.0000 mg | ORAL_TABLET | Freq: Four times a day (QID) | ORAL | Status: DC | PRN

## 2019-12-26 MED ORDER — DOCUSATE SODIUM 100 MG PO CAPS
100.0000 mg | ORAL_CAPSULE | Freq: Two times a day (BID) | ORAL | Status: DC
  Administered 2019-12-26 – 2019-12-29 (×6): 100 mg via ORAL
  Filled 2019-12-26 (×6): qty 1

## 2019-12-26 MED ORDER — ACETAMINOPHEN 325 MG PO TABS: 650.0000 mg | ORAL_TABLET | Freq: Four times a day (QID) | ORAL | Status: DC | PRN

## 2019-12-26 MED ORDER — ONDANSETRON HCL 4 MG PO TABS: 4.0000 mg | ORAL_TABLET | Freq: Four times a day (QID) | ORAL | Status: DC | PRN

## 2019-12-26 MED ORDER — HYDROCODONE-ACETAMINOPHEN 5-325 MG PO TABS
1.0000 | ORAL_TABLET | ORAL | Status: DC | PRN
  Administered 2019-12-26 – 2019-12-27 (×4): 2 via ORAL
  Administered 2019-12-27 – 2019-12-28 (×2): 1 via ORAL
  Administered 2019-12-28: 2 via ORAL
  Administered 2019-12-28: 1 via ORAL
  Administered 2019-12-28 – 2019-12-29 (×3): 2 via ORAL
  Filled 2019-12-26: qty 1
  Filled 2019-12-26: qty 2
  Filled 2019-12-26: qty 1
  Filled 2019-12-26 (×9): qty 2

## 2019-12-26 MED ORDER — SODIUM CHLORIDE 0.9 % IV BOLUS
1000.0000 mL | Freq: Once | INTRAVENOUS | Status: AC
  Administered 2019-12-26: 1000 mL via INTRAVENOUS

## 2019-12-26 MED ORDER — NICOTINE 14 MG/24HR TD PT24
14.0000 mg | MEDICATED_PATCH | Freq: Every day | TRANSDERMAL | Status: DC
  Filled 2019-12-26 (×3): qty 1

## 2019-12-26 MED ORDER — HYDRALAZINE HCL 20 MG/ML IJ SOLN: 5.0000 mg | INTRAMUSCULAR | Status: DC | PRN

## 2019-12-26 MED ORDER — HYDROMORPHONE HCL 1 MG/ML IJ SOLN
0.5000 mg | Freq: Once | INTRAMUSCULAR | Status: AC
  Administered 2019-12-26: 0.5 mg via INTRAVENOUS
  Filled 2019-12-26: qty 1

## 2019-12-26 MED ORDER — ZOLPIDEM TARTRATE 5 MG PO TABS: 5.0000 mg | ORAL_TABLET | Freq: Every evening | ORAL | Status: DC | PRN

## 2019-12-26 MED ORDER — ONDANSETRON HCL 4 MG/2ML IJ SOLN: 4.0000 mg | Freq: Four times a day (QID) | INTRAMUSCULAR | Status: DC | PRN

## 2019-12-26 MED ORDER — BISACODYL 5 MG PO TBEC: 5.0000 mg | DELAYED_RELEASE_TABLET | Freq: Every day | ORAL | Status: DC | PRN

## 2019-12-26 MED ORDER — ACETAMINOPHEN 650 MG RE SUPP: 650.0000 mg | Freq: Four times a day (QID) | RECTAL | Status: DC | PRN

## 2019-12-26 MED ORDER — MORPHINE SULFATE (PF) 2 MG/ML IV SOLN
2.0000 mg | INTRAVENOUS | Status: DC | PRN
  Administered 2019-12-26 – 2019-12-28 (×3): 2 mg via INTRAVENOUS
  Filled 2019-12-26 (×3): qty 1

## 2019-12-26 MED ORDER — METHOCARBAMOL 1000 MG/10ML IJ SOLN
500.0000 mg | Freq: Four times a day (QID) | INTRAVENOUS | Status: DC | PRN
  Filled 2019-12-26: qty 5

## 2019-12-26 MED ORDER — POLYETHYLENE GLYCOL 3350 17 G PO PACK: 17.0000 g | PACK | Freq: Every day | ORAL | Status: DC | PRN

## 2019-12-26 NOTE — ED Notes (Signed)
Pt was found standing next to bed, stating that the pain is incredibly significant and cannot sit down. PRN medications given.

## 2019-12-26 NOTE — ED Triage Notes (Signed)
Patient stated Dr. Farris Has need patient admitted for L4-L5 infection

## 2019-12-26 NOTE — ED Notes (Signed)
Report attempted 

## 2019-12-26 NOTE — H&P (Signed)
History and Physical    Karl Aguilar GBT:[id number] DOB: Jan 25, 1971 DOA: 12/26/2019  PCP: [employer], MD Consultants:  Alfonso Ramus - orthopedics Patient coming from:  Home - lives with wife and 2 kids; NOK: Wife, [phone]  Chief Complaint: back pain  HPI: Karl Aguilar is a 49 y.o. male without significant past medical history presenting with severe back pain.  His back "just hurts."  This time, it has bene hurting since the end of 25-Sep-2022.  No specific injury.  He injured it in his 13s initially, bulging discs and DDD.  He has never had surgery on it.  He has always done PT and been able to recover.  His last episode prior was in 10/2018 and he was out for a couple of weeks; PT helped and he returned to regular life.  The pain awoke him from a dead sleep in 09/25/22 - unable to get out of bed, hurt to move, lie down, sit up, stand up.  He couldn't eat/drink for 4 days.  It finally eased enough to allow him a little mobility.  It was diffusely across the low back (this was different because it was on his left lower back).  It got better and he was able to walk in his driveway; he thinks increased ROM exercises made it better into February.  Toward the middle of March, it started getting worse again.  He does have L knee pain.  Right before his back started hurting he had a pustule on his left knee; had I&D and took oral antibiotics and used a cream for it.  His knee also swelled up but erythema and warmth to touch.  As he started walking more, his knee started swelling more.  He was seeing Dr. Alfonso Ramus periodically for his back; he has claustrophobia and so they scheduled him for an MRI under sedation.  He currently cannot lie flat, can only sit and stand.  He sleeps in a recliner.  Transient radiculopathy, not present currently.  No fevers since 2022/09/25.  At the very beginning, he had temp up to 102 for maybe a week.      ED Course:  Healthy hard-working patient presenting with acute on chronic back  pain.  MRI with L4-5 discitis.  Dr. Alfonso Ramus suggests ID/IR consults.  ID says no antibiotics.  IR to biopsy.  Review of Systems: As per HPI; otherwise review of systems reviewed and negative.   Ambulatory Status:  Ambulates with a crutch now due to pain  COVID Vaccine Status:  None  Past Medical History:  Diagnosis Date  . History of kidney stones     Past Surgical History:  Procedure Laterality Date  . APPENDECTOMY    . CHOLECYSTECTOMY N/A 08/02/2013   Procedure: LAPAROSCOPIC CHOLECYSTECTOMY;  Surgeon: Jamesetta So, MD;  Location: [facility];  Service: General;  Laterality: N/A;  . RADIOLOGY WITH ANESTHESIA N/A 12/24/2019   Procedure: MRI LUMBER SPINE WITHOUT CONTRAST;  Surgeon: Radiologist, Medication, MD;  Location: [facility];  Service: Radiology;  Laterality: N/A;    Social History   Socioeconomic History  . Marital status: Married    Spouse name: Not on file  . Number of children: Not on file  . Years of education: Not on file  . Highest education level: Not on file  Occupational History  . Occupation: Armed forces technical officer  Tobacco Use  . Smoking status: Current Every Day Smoker    Packs/day: 1.00    Years: 32.00    Pack years: 32.00  Types: Cigarettes  . Smokeless tobacco: Never Used  Substance and Sexual Activity  . Alcohol use: Yes    Comment: occasional  . Drug use: No  . Sexual activity: Yes    Birth control/protection: None  Other Topics Concern  . Not on file  Social History Narrative  . Not on file   Social Determinants of Health   Financial Resource Strain:   . Difficulty of Paying Living Expenses:   Food Insecurity:   . Worried About Charity fundraiser in the Last Year:   . Arboriculturist in the Last Year:   Transportation Needs:   . Film/video editor (Medical):   Marland Kitchen Lack of Transportation (Non-Medical):   Physical Activity:   . Days of Exercise per Week:   . Minutes of Exercise per Session:   Stress:   . Feeling of Stress :   Social  Connections:   . Frequency of Communication with Friends and Family:   . Frequency of Social Gatherings with Friends and Family:   . Attends Religious Services:   . Active Member of Clubs or Organizations:   . Attends Archivist Meetings:   Marland Kitchen Marital Status:   Intimate Partner Violence:   . Fear of Current or Ex-Partner:   . Emotionally Abused:   Marland Kitchen Physically Abused:   . Sexually Abused:     Allergies  Allergen Reactions  . Asa [Aspirin] Nausea And Vomiting    History reviewed. No pertinent family history.  Prior to Admission medications   Medication Sig Start Date End Date Taking? Authorizing Provider  ibuprofen (ADVIL) 200 MG tablet Take 800 mg by mouth every 8 (eight) hours as needed (for pain.).    [provider]  predniSONE (DELTASONE) 10 MG tablet Take 10-60 mg by mouth as directed.  12/03/19   [provider]  traMADol (ULTRAM) 50 MG tablet Take 50 mg by mouth every 4 (four) hours as needed for moderate pain.    [provider]    Physical Exam: Vitals:   12/26/19 1145 12/26/19 1149 12/26/19 1439 12/26/19 1702  BP: (!) 157/86  (!) 144/100 138/89  Pulse: (!) 111  100 85  Resp: 20  18 18   Temp: 98.3 F (36.8 C)     TempSrc: Oral     SpO2: 99%  100% 99%  Weight:  85.3 kg    Height:  5' 11"  (1.803 m)       . General:  Appears calm and comfortable and is NAD while at rest; he has significant pain in precisely the region of his L4-5 osteo with any attempt at movement . Eyes:  PERRL, EOMI, normal lids, iris . ENT:  grossly normal hearing, lips & tongue, mmm; appropriate dentition . Neck:  no LAD, masses or thyromegaly . Cardiovascular:  RRR, no m/r/g. No LE edema.  Marland Kitchen Respiratory:   CTA bilaterally with no wheezes/rales/rhonchi.  Normal respiratory effort. . Abdomen:  soft, NT, ND, NABS . Back:   normal alignment, mild paraspinous muscle TTP, marked TTP over the L4-5 region midline . Skin:  no rash or induration seen on limited  exam; however, he does have a well healed area of prior drainage from an apparent pustule along his anterior left lower knee in the infrapatellar region     . Musculoskeletal:  grossly normal tone BUE/BLE, good ROM, no bony abnormality; his left knee has a chronic suprapatellar effusion that does not appear to be actively infected    .  Marland Kitchen  Psychiatric:  grossly normal mood and affect, speech fluent and appropriate, AOx3 . Neurologic:  CN 2-12 grossly intact, moves all extremities in coordinated fashion but with significant reluctance with his B LE due to pain in the L4-5 region, sensation intact    Radiological Exams on Admission: DG Chest 2 View  Result Date: 12/26/2019 CLINICAL DATA:  Lower back pain, possible sepsis. EXAM: CHEST - 2 VIEW COMPARISON:  July 28, 2013. FINDINGS: The heart size and mediastinal contours are within normal limits. Both lungs are clear. No pneumothorax or pleural effusion is noted. The visualized skeletal structures are unremarkable. IMPRESSION: No active cardiopulmonary disease. Electronically Signed   By: Marijo Conception M.D.   On: 12/26/2019 12:27    EKG: not done   Labs on Admission: I have personally reviewed the available labs and imaging studies at the time of the admission.  Pertinent labs:   Glucose 119 Anion gap 16 Lactate 2.7, 1.3 CRP 7.1 ESR 80 WBC 9.0 Hgb 11.8 INR 1.0 Respiratory panel PCR pending UA: small hgb, moderate LE, +nitrite, rare bacteria   Assessment/Plan Principal Problem:   Acute osteomyelitis of lumbar spine (HCC) Active Problems:   Tobacco dependence    L-spine osteo -Patient presenting with acute back pain that has been progressive since Jan -Just prior to onset of symptoms, he had what sounds like a pustule adjacent to his left knee and possibly septic arthritis of the knee, treated with oral antibiotics -Concerning lab findings include mildly elevated CRP (not >13.5), elevated ESR (goal is < 1/2 age +19 if  male - so 37, and his is 57).  WBC is often <15 and is in this case. -Vertebral osteo is more common in the L-spine and usually presents with severe pain -MRI confirms osteo -No radicular symptoms currently -Will need antibiotics for 4-6 weeks. -With vertebral osteo, needs testing for TB. -Further important considerations include nutrition (consult requested), diabetes control (no known DM but mildly elevated glucose so will check A1c), tobacco cessation (see below). -Needs IR drainage prior to initiation of abx; this was not possible today due to schedule burden but hopefully will be able to make sure this happens tomorrow and order has been placed. -ID consultation pending. -Based on history, I am suspicious for MRSA infection that seeded the spine; MRSA PCR screening ordered. -Will also check RPR and GC/Chl as well as UDS, although lower suspicion for these issues. -Blood cultures are pending, as well. -UA is mildly abnormal and suggestive of UTI, but he does not report symptoms; will check urine culture primarily as an additional possible explanation for source of osteo.  Tobacco dependence -Encourage cessation.   -This was discussed with the patient and should be reviewed on an ongoing basis.   -Patch ordered although patient reports not needing this and does not appear inclined to quit.     Note: This patient has been tested and is pending for the novel coronavirus COVID-19.  DVT prophylaxis: SCDs Code Status:  Full - confirmed with patient/family Family Communication: Wife was present throughout evaluation Disposition Plan:  The patient is from: home  Anticipated d/c is to: home with [facility] services (IV antibiotics)  Anticipated d/c date will depend on clinical response to treatment, likely sometime early next week   Patient is currently: acutely ill Consults called: ID; orthopedics; IR; nutrition Admission status:  Admit - It is my clinical opinion that admission to INPATIENT is  reasonable and necessary because of the expectation that this patient will require hospital  care that crosses at least 2 midnights to treat this condition based on the medical complexity of the problems presented.  Given the aforementioned information, the predictability of an adverse outcome is felt to be significant.    Karmen Bongo MD [facility]   How to contact the [facility] Attending or Consulting provider [place] or covering provider during after hours [place], for this patient?  1. Check the care team in [facility] and look for a) attending/consulting [facility] provider listed and b) the [facility] team listed 2. Log into [email or web address] and use 's universal password to access. If you do not have the password, please contact the hospital operator. 3. Locate the [facility] provider you are looking for under [facility] and page to a number that you can be directly reached. 4. If you still have difficulty reaching the provider, please page the [facility] (Director on Call) for the Hospitalists listed on amion for assistance.   12/26/2019, 6:02 PM

## 2019-12-26 NOTE — ED Provider Notes (Signed)
[facility]arl Aguilar [facility] EMERGENCY DEPARTMENT Provider Note   CSN: [id number] Arrival date & time: 12/26/19  1138     History Chief Complaint  Patient presents with  . DISCITIS    Karl Aguilar is a 49 y.o. male.  Presents to ER with chief complaint back pain, abnormal MRI.  Patient reports he has had history of chronic low back pain, normally improves with physical therapy.  Has been having worsening pain since January/February.  Saw Dr. Farris Has.  Initially had some improvement but then over the past few weeks has had worsening pain.  Recently on course of steroids which seemed to improve symptoms, steroids stopped few days ago.  Since stopping steroids, pain has been worsening.  Pain now 10 out of 10 in severity sharp, stabbing, worse with certain positions, improves with standing up.  Took tramadol this morning with some improvement.  MRI obtained 4/27 was concerning for discitis at L4, L5.  Sent to ER for admission.  No numbness, weakness, bladder or bowel incontinence.  No fever.  No history of IVDU, no endocarditis, heart valve problem, no prior back surgery history.  Chart review, review of MRI on 4/27.  HPI     Past Medical History:  Diagnosis Date  . History of kidney stones     Patient Active Problem List   Diagnosis Date Noted  . Acute osteomyelitis of lumbar spine (HCC) 12/26/2019    Past Surgical History:  Procedure Laterality Date  . APPENDECTOMY    . CHOLECYSTECTOMY N/A 08/02/2013   Procedure: LAPAROSCOPIC CHOLECYSTECTOMY;  Surgeon: Dalia Heading, MD;  Location: [facility];  Service: General;  Laterality: N/A;  . RADIOLOGY WITH ANESTHESIA N/A 12/24/2019   Procedure: MRI LUMBER SPINE WITHOUT CONTRAST;  Surgeon: Radiologist, Medication, MD;  Location: [facility];  Service: Radiology;  Laterality: N/A;       History reviewed. No pertinent family history.  Social History   Tobacco Use  . Smoking status: Current Every Day Smoker    Packs/day: 1.00    Years:  32.00    Pack years: 32.00    Types: Cigarettes  . Smokeless tobacco: Never Used  Substance Use Topics  . Alcohol use: Yes    Comment: occasional  . Drug use: No    Home Medications Prior to Admission medications   Medication Sig Start Date End Date Taking? Authorizing Provider  ibuprofen (ADVIL) 200 MG tablet Take 800 mg by mouth every 8 (eight) hours as needed (for pain.).    [provider]  predniSONE (DELTASONE) 10 MG tablet Take 10-60 mg by mouth as directed.  12/03/19   [provider]  traMADol (ULTRAM) 50 MG tablet Take 50 mg by mouth every 4 (four) hours as needed for moderate pain.    [provider]    Allergies    Asa [aspirin]  Review of Systems   Review of Systems  Constitutional: Negative for chills and fever.  HENT: Negative for ear pain and sore throat.   Eyes: Negative for pain and visual disturbance.  Respiratory: Negative for cough and shortness of breath.   Cardiovascular: Negative for chest pain and palpitations.  Gastrointestinal: Negative for abdominal pain and vomiting.  Genitourinary: Negative for dysuria and hematuria.  Musculoskeletal: Positive for back pain. Negative for arthralgias.  Skin: Negative for color change and rash.  Neurological: Negative for seizures and syncope.  All other systems reviewed and are negative.   Physical Exam Updated Vital Signs BP 138/89   Pulse 85  Temp 98.3 F (36.8 C) (Oral)   Resp 18   Ht 5\' 11"  (1.803 m)   Wt 85.3 kg   SpO2 99%   BMI 26.22 kg/m   Physical Exam Vitals and nursing note reviewed.  Constitutional:      Appearance: He is well-developed.  HENT:     Head: Normocephalic and atraumatic.  Eyes:     Conjunctiva/sclera: Conjunctivae normal.  Cardiovascular:     Rate and Rhythm: Normal rate and regular rhythm.     Heart sounds: No murmur.  Pulmonary:     Effort: Pulmonary effort is normal. No respiratory distress.     Breath sounds: Normal breath sounds.    Abdominal:     Palpations: Abdomen is soft.     Tenderness: There is no abdominal tenderness.  Musculoskeletal:     Cervical back: Neck supple.     Comments: Tenderness over lumbar spine, no deformity or step-off noted  Skin:    General: Skin is warm and dry.  Neurological:     Mental Status: He is alert.     ED Results / Procedures / Treatments   Labs (all labs ordered are listed, but only abnormal results are displayed) Labs Reviewed  COMPREHENSIVE METABOLIC PANEL - Abnormal; Notable for the following components:      Result Value   CO2 21 (*)    Glucose, Bld 119 (*)    Albumin 3.2 (*)    Anion gap 16 (*)    All other components within normal limits  LACTIC ACID, PLASMA - Abnormal; Notable for the following components:   Lactic Acid, Venous 2.7 (*)    All other components within normal limits  CBC WITH DIFFERENTIAL/PLATELET - Abnormal; Notable for the following components:   Hemoglobin 11.8 (*)    HCT 38.2 (*)    All other components within normal limits  URINALYSIS, ROUTINE W REFLEX MICROSCOPIC - Abnormal; Notable for the following components:   Hgb urine dipstick SMALL (*)    Nitrite POSITIVE (*)    Leukocytes,Ua MODERATE (*)    Bacteria, UA RARE (*)    All other components within normal limits  SEDIMENTATION RATE - Abnormal; Notable for the following components:   Sed Rate 80 (*)    All other components within normal limits  C-REACTIVE PROTEIN - Abnormal; Notable for the following components:   CRP 7.1 (*)    All other components within normal limits  CULTURE, BLOOD (ROUTINE X 2)  CULTURE, BLOOD (ROUTINE X 2)  RESPIRATORY PANEL BY RT PCR (FLU A&B, COVID)  LACTIC ACID, PLASMA  PROTIME-INR    EKG None  Radiology DG Chest 2 View  Result Date: 12/26/2019 CLINICAL DATA:  Lower back pain, possible sepsis. EXAM: CHEST - 2 VIEW COMPARISON:  July 28, 2013. FINDINGS: The heart size and mediastinal contours are within normal limits. Both lungs are clear. No  pneumothorax or pleural effusion is noted. The visualized skeletal structures are unremarkable. IMPRESSION: No active cardiopulmonary disease. Electronically Signed   By: July 30, 2013 M.D.   On: 12/26/2019 12:27    Procedures Procedures (including critical care time)  Medications Ordered in ED Medications  HYDROmorphone (DILAUDID) injection 0.5 mg (0.5 mg Intravenous Given 12/26/19 1616)  sodium chloride 0.9 % bolus 1,000 mL (1,000 mLs Intravenous New Bag/Given 12/26/19 1700)    ED Course  I have reviewed the triage vital signs and the nursing notes.  Pertinent labs & imaging results that were available during my care of the patient were  reviewed by me and considered in my medical decision making (see chart for details).  Clinical Course as of Dec 25 1740  Thu Dec 26, 2019  1600 D/w Alfonso Ramus - requests medicine admission, consult with IR and ID    [RD]  8676 D/w Tommy Medal - hold off on antibiotics until IR can get biopsy   [RD]  1632 D/w [facility] IR - requests IP team contact IR to set up consult - can't get done today   [RD]    Clinical Course User Index [RD] Lucrezia Starch, MD   MDM Rules/Calculators/A&P                      49 year old male who presented to ER with concern for discitis/osteo.  Diagnosis by outpatient MRI.  Here, afebrile, uncomfortable from back pain but not toxic appearing.  Reviewed case with his orthopedist, IR, ID.  ID requests holding off on antibiotics until IR can get tissue.  Labs notable for no leukocytosis. Initial lactate slightly elevated though patient without fever or other systemic signs, pt does not appear septic. Believe patient stable at this time to hold off on starting empiric abx per rec from ID. Consulted hospitalist for admission.  Dr. Lorin Mercy accepting.  Final Clinical Impression(s) / ED Diagnoses Final diagnoses:  Discitis of lumbar region  Osteomyelitis of lumbar spine [facility])    Rx / DC Orders ED Discharge Orders    None         Lucrezia Starch, MD 12/26/19 1743

## 2019-12-26 NOTE — Plan of Care (Signed)
  Problem: Education: Goal: Knowledge of General Education information will improve Description: Including pain rating scale, medication(s)/side effects and non-pharmacologic comfort measures Outcome: Progressing   Problem: Health Behavior/Discharge Planning: Goal: Ability to manage health-related needs will improve Outcome: Progressing   Problem: Clinical Measurements: Goal: Ability to maintain clinical measurements within normal limits will improve Outcome: Progressing  Education hand out given to patient as well.

## 2019-12-27 ENCOUNTER — Inpatient Hospital Stay (HOSPITAL_COMMUNITY): Payer: BC Managed Care – PPO

## 2019-12-27 DIAGNOSIS — M4626 Osteomyelitis of vertebra, lumbar region: Secondary | ICD-10-CM | POA: Diagnosis not present

## 2019-12-27 DIAGNOSIS — M4646 Discitis, unspecified, lumbar region: Secondary | ICD-10-CM | POA: Diagnosis not present

## 2019-12-27 HISTORY — PX: IR LUMBAR DISC ASPIRATION W/IMG GUIDE: IMG5306

## 2019-12-27 LAB — CBC
HCT: 35.3 % — ABNORMAL LOW (ref 39.0–52.0)
Hemoglobin: 11.4 g/dL — ABNORMAL LOW (ref 13.0–17.0)
MCH: 28 pg (ref 26.0–34.0)
MCHC: 32.3 g/dL (ref 30.0–36.0)
MCV: 86.7 fL (ref 80.0–100.0)
Platelets: 253 10*3/uL (ref 150–400)
RBC: 4.07 MIL/uL — ABNORMAL LOW (ref 4.22–5.81)
RDW: 14.4 % (ref 11.5–15.5)
WBC: 6.3 10*3/uL (ref 4.0–10.5)
nRBC: 0 % (ref 0.0–0.2)

## 2019-12-27 LAB — LIPID PANEL
Cholesterol: 180 mg/dL (ref 0–200)
HDL: 45 mg/dL (ref >40)
LDL Cholesterol: 119 mg/dL — ABNORMAL HIGH (ref 0–99)
Total CHOL/HDL Ratio: 4 RATIO
Triglycerides: 80 mg/dL (ref <150)
VLDL: 16 mg/dL (ref 0–40)

## 2019-12-27 LAB — BASIC METABOLIC PANEL
Anion gap: 11 (ref 5–15)
BUN: 11 mg/dL (ref 6–20)
CO2: 26 mmol/L (ref 22–32)
Calcium: 9.6 mg/dL (ref 8.9–10.3)
Chloride: 99 mmol/L (ref 98–111)
Creatinine, Ser: 1.1 mg/dL (ref 0.61–1.24)
GFR calc Af Amer: >60 mL/min (ref >60)
GFR calc non Af Amer: >60 mL/min (ref >60)
Glucose, Bld: 119 mg/dL — ABNORMAL HIGH (ref 70–99)
Potassium: 3.7 mmol/L (ref 3.5–5.1)
Sodium: 136 mmol/L (ref 135–145)

## 2019-12-27 LAB — MRSA PCR SCREENING: MRSA by PCR: NEGATIVE

## 2019-12-27 LAB — HIV ANTIBODY (ROUTINE TESTING W REFLEX): HIV Screen 4th Generation wRfx: NONREACTIVE

## 2019-12-27 LAB — TSH
TSH: 1.327 u[IU]/mL (ref 0.350–4.500)
TSH: 1.527 u[IU]/mL (ref 0.350–4.500)

## 2019-12-27 LAB — RPR: RPR Ser Ql: NONREACTIVE

## 2019-12-27 LAB — GLUCOSE, CAPILLARY
Glucose-Capillary: 140 mg/dL — ABNORMAL HIGH (ref 70–99)
Glucose-Capillary: 146 mg/dL — ABNORMAL HIGH (ref 70–99)

## 2019-12-27 MED ORDER — HYDROMORPHONE HCL 1 MG/ML IJ SOLN
INTRAMUSCULAR | Status: AC
  Filled 2019-12-27: qty 1

## 2019-12-27 MED ORDER — HYDROMORPHONE HCL 1 MG/ML IJ SOLN
INTRAMUSCULAR | Status: AC | PRN
  Administered 2019-12-27 (×2): 1 mg via INTRAVENOUS

## 2019-12-27 MED ORDER — INSULIN ASPART 100 UNIT/ML ~~LOC~~ SOLN: 0.0000 [IU] | Freq: Every day | SUBCUTANEOUS | Status: DC

## 2019-12-27 MED ORDER — ADULT MULTIVITAMIN W/MINERALS CH
1.0000 | ORAL_TABLET | Freq: Every day | ORAL | Status: DC
  Administered 2019-12-28 – 2019-12-29 (×2): 1 via ORAL
  Filled 2019-12-27 (×2): qty 1

## 2019-12-27 MED ORDER — LACTATED RINGERS IV SOLN: INTRAVENOUS | Status: AC

## 2019-12-27 MED ORDER — BUPIVACAINE HCL (PF) 0.5 % IJ SOLN
INTRAMUSCULAR | Status: AC
  Filled 2019-12-27: qty 30

## 2019-12-27 MED ORDER — INSULIN ASPART 100 UNIT/ML ~~LOC~~ SOLN
0.0000 [IU] | Freq: Three times a day (TID) | SUBCUTANEOUS | Status: DC
  Administered 2019-12-27: 1 [IU] via SUBCUTANEOUS
  Administered 2019-12-28: 2 [IU] via SUBCUTANEOUS
  Administered 2019-12-28: 1 [IU] via SUBCUTANEOUS

## 2019-12-27 MED ORDER — METHOCARBAMOL 500 MG PO TABS
500.0000 mg | ORAL_TABLET | Freq: Once | ORAL | Status: AC
  Administered 2019-12-27: 500 mg via ORAL

## 2019-12-27 MED ORDER — MIDAZOLAM HCL 2 MG/2ML IJ SOLN
INTRAMUSCULAR | Status: AC
  Filled 2019-12-27: qty 4

## 2019-12-27 MED ORDER — BUPIVACAINE HCL (PF) 0.5 % IJ SOLN
INTRAMUSCULAR | Status: AC | PRN
  Administered 2019-12-27: 20 mL

## 2019-12-27 MED ORDER — LIVING WELL WITH DIABETES BOOK
Freq: Once | Status: DC
  Filled 2019-12-27: qty 1

## 2019-12-27 MED ORDER — FENTANYL CITRATE (PF) 100 MCG/2ML IJ SOLN
INTRAMUSCULAR | Status: AC
  Filled 2019-12-27: qty 4

## 2019-12-27 MED ORDER — MIDAZOLAM HCL 2 MG/2ML IJ SOLN
INTRAMUSCULAR | Status: AC | PRN
  Administered 2019-12-27 (×2): 1 mg via INTRAVENOUS

## 2019-12-27 MED ORDER — FENTANYL CITRATE (PF) 100 MCG/2ML IJ SOLN
INTRAMUSCULAR | Status: AC | PRN
  Administered 2019-12-27 (×3): 25 ug via INTRAVENOUS

## 2019-12-27 NOTE — Progress Notes (Signed)
Back from IR

## 2019-12-27 NOTE — Progress Notes (Signed)
To IR for procedure.

## 2019-12-27 NOTE — Plan of Care (Signed)

## 2019-12-27 NOTE — Progress Notes (Signed)
PROGRESS NOTE  BAER HINTON NOM:[id number] DOB: December 25, 1970 DOA: 12/26/2019 PCP: Redmond [employer], MD  HPI/Recap of past 24 hours: Karl Aguilar is a 49 y.o. male without significant past medical history presenting with severe back pain.  His back "just hurts."  This time, it has bene hurting since the end of 02-Oct-2022.  No specific injury.  He injured it in his 78s initially, bulging discs and DDD.  He has never had surgery on it.  He has always done PT and been able to recover.  His last episode prior was in 10/2018 and he was out for a couple of weeks; PT helped and he returned to regular life.  The pain awoke him from a dead sleep in October 02, 2022 - unable to get out of bed, hurt to move, lie down, sit up, stand up.  He couldn't eat/drink for 4 days.  It finally eased enough to allow him a little mobility.  It was diffusely across the low back (this was different because it was on his left lower back).  It got better and he was able to walk in his driveway; he thinks increased ROM exercises made it better into February.  Toward the middle of March, it started getting worse again.  He does have L knee pain.  Right before his back started hurting he had a pustule on his left knee; had I&D and took oral antibiotics and used a cream for it.  His knee also swelled up but erythema and warmth to touch.  As he started walking more, his knee started swelling more.  He was seeing Dr. Alfonso Ramus periodically for his back; he has claustrophobia and so they scheduled him for an MRI under sedation.  He currently cannot lie flat, can only sit and stand.  He sleeps in a recliner.  Transient radiculopathy, not present currently.  No fevers since 02-Oct-2022.  At the very beginning, he had temp up to 102 for maybe a week.    ED Course:  Presenting with acute on chronic back pain.  MRI with L4-5 discitis.  Dr. Alfonso Ramus suggests ID/IR consults.  ID says no antibiotics.  IR to biopsy.  S/p L4-L5 fluoroscopy guided disc aspiration by IR,  sent for analysis on 12/27/2019.   12/27/19: Seen and examined with his wife at bedside.  He is sitting in a chair where he slept.   Assessment/Plan: Principal Problem:   Acute osteomyelitis of lumbar spine (HCC) Active Problems:   Tobacco dependence  L4-L5 discitis with unclear source No evidence of systemic infection, no leukocytosis and afebrile. Sed rate 80, CRP 7.1 Status post L4-L5 fluoroscopy guided cyst aspiration, body fluid sent for analysis Blood cultures negative to date Continue to follow cultures Defer IV antibiotics to infectious disease Continue pain control with bowel regimen as needed  Suspected type 2 diabetes Not on oral hypoglycemics prior to admission Hemoglobin A1c 7.1 Start insulin sliding scale Obtain lipid panel Diabetes coordinator to educate  Elevated BP suspect contributed by pain BP not at goal IV hydralazine 5 mg every 4 hours as needed for SBP greater than 160 Continue to monitor vital signs If persistent will add Norvasc  Transient sinus tachycardia Obtain TSH Start gentle IV fluid hydration Continue to monitor vital signs  Ambulatory dysfunction in the setting of chronic back pain PT OT to assess for recommendation  Asymptomatic pyuria UA positive for pyuria Urine culture pending  Tobacco use disorder Nicotine patch as needed   DVT prophylaxis: SCDs Code Status:  Full - confirmed with patient/family Family Communication: Wife was present throughout evaluation Disposition Plan:The patient is from: home             Anticipated d/c is to: home with [facility] services (IV antibiotics)             Anticipated d/c date will depend on clinical response to treatment, likely sometime early next week              Patient is currently: acutely ill Consults called: ID; orthopedics; IR; nutrition     Objective: Vitals:   12/27/19 1510 12/27/19 1515 12/27/19 1520 12/27/19 1525  BP: (!) 155/69 (!) 145/92 (!) 162/101 (!) 159/83  Pulse:  (!) 114 (!) 104 (!) 118 (!) 116  Resp: 20 18 18 20   Temp:      TempSrc:      SpO2: 100% 100% 100% 98%  Weight:      Height:        Intake/Output Summary (Last 24 hours) at 12/27/2019 1534 Last data filed at 12/27/2019 0900 Gross per 24 hour  Intake 1000 ml  Output --  Net 1000 ml   Filed Weights   12/26/19 1149  Weight: 85.3 kg    Exam:  . General: 49 y.o. year-old male well developed well nourished in no acute distress.  Alert and oriented x3. . Cardiovascular: Regular rate and rhythm with no rubs or gallops.  No thyromegaly or JVD noted.   54 Respiratory: Clear to auscultation with no wheezes or rales. Good inspiratory effort. . Abdomen: Soft nontender nondistended with normal bowel sounds x4 quadrants. . Musculoskeletal: No lower extremity edema. 2/4 pulses in all 4 extremities. Marland Kitchen Psychiatry: Mood is appropriate for condition and setting   Data Reviewed: CBC: Recent Labs  Lab 12/26/19 1154 12/27/19 1214  WBC 9.0 6.3  NEUTROABS 5.4  --   HGB 11.8* 11.4*  HCT 38.2* 35.3*  MCV 89.3 86.7  PLT 333 253   Basic Metabolic Panel: Recent Labs  Lab 12/24/19 0856 12/26/19 1154 12/27/19 0548  NA 136 137 136  K 3.9 3.7 3.7  CL 104 100 99  CO2 22 21* 26  GLUCOSE 117* 119* 119*  BUN 10 10 11   CREATININE 1.08 1.11 1.10  CALCIUM 9.6 9.6 9.6   GFR: Estimated Creatinine Clearance: 86.5 mL/min (by C-G formula based on SCr of 1.1 mg/dL). Liver Function Tests: Recent Labs  Lab 12/26/19 1154  AST 16  ALT 35  ALKPHOS 115  BILITOT 0.7  PROT 7.3  ALBUMIN 3.2*   No results for input(s): LIPASE, AMYLASE in the last 168 hours. No results for input(s): AMMONIA in the last 168 hours. Coagulation Profile: Recent Labs  Lab 12/26/19 1154  INR 1.0   Cardiac Enzymes: No results for input(s): CKTOTAL, CKMB, CKMBINDEX, TROPONINI in the last 168 hours. BNP (last 3 results) No results for input(s): PROBNP in the last 8760 hours. HbA1C: Recent Labs    12/26/19 2115   HGBA1C 7.1*   CBG: No results for input(s): GLUCAP in the last 168 hours. Lipid Profile: No results for input(s): CHOL, HDL, LDLCALC, TRIG, CHOLHDL, LDLDIRECT in the last 72 hours. Thyroid Function Tests: Recent Labs    12/27/19 0548  TSH 1.327   Anemia Panel: No results for input(s): VITAMINB12, FOLATE, FERRITIN, TIBC, IRON, RETICCTPCT in the last 72 hours. Urine analysis:    Component Value Date/Time   COLORURINE YELLOW 12/26/2019 1644   APPEARANCEUR CLEAR 12/26/2019 1644   LABSPEC 1.012 12/26/2019 1644  PHURINE 5.0 12/26/2019 1644   GLUCOSEU NEGATIVE 12/26/2019 1644   HGBUR SMALL (A) 12/26/2019 1644   BILIRUBINUR NEGATIVE 12/26/2019 1644   KETONESUR NEGATIVE 12/26/2019 1644   PROTEINUR NEGATIVE 12/26/2019 1644   NITRITE POSITIVE (A) 12/26/2019 1644   LEUKOCYTESUR MODERATE (A) 12/26/2019 1644   Sepsis Labs: @LABRCNTIP (procalcitonin:4,lacticidven:4)  ) Recent Results (from the past 240 hour(s))  SARS CORONAVIRUS 2 (TAT 6-24 HRS) Nasopharyngeal Nasopharyngeal Swab     Status: None   Collection Time: 12/21/19  1:44 PM   Specimen: Nasopharyngeal Swab  Result Value Ref Range Status   SARS Coronavirus 2 NEGATIVE NEGATIVE Final    Comment: (NOTE) SARS-CoV-2 target nucleic acids are NOT DETECTED. The SARS-CoV-2 RNA is generally detectable in upper and lower respiratory specimens during the acute phase of infection. Negative results do not preclude SARS-CoV-2 infection, do not rule out co-infections with other pathogens, and should not be used as the sole basis for treatment or other patient management decisions. Negative results must be combined with clinical observations, patient history, and epidemiological information. The expected result is Negative. Fact Sheet for Patients: 12/23/19 Fact Sheet for Healthcare Providers: HairSlick.no This test is not yet approved or cleared by the quierodirigir.com FDA and   has been authorized for detection and/or diagnosis of SARS-CoV-2 by FDA under an Emergency Use Authorization (EUA). This EUA will remain  in effect (meaning this test can be used) for the duration of the COVID-19 declaration under Section 56 4(b)(1) of the Act, 21 U.S.C. section 360bbb-3(b)(1), unless the authorization is terminated or revoked sooner. Performed at [facility], [address], [place], [place]   Culture, blood (Routine x 2)     Status: None (Preliminary result)   Collection Time: 12/26/19 11:55 AM   Specimen: BLOOD  Result Value Ref Range Status   Specimen Description BLOOD RIGHT ANTECUBITAL  Final   Special Requests   Final    BOTTLES DRAWN AEROBIC AND ANAEROBIC Blood Culture adequate volume   Culture   Final    NO GROWTH < 24 HOURS Performed at [facility], [address], [place], [place]    Report Status PENDING  Incomplete  Respiratory Panel by RT PCR (Flu A&B, Covid) - Nasopharyngeal Swab     Status: None   Collection Time: 12/26/19  1:46 PM   Specimen: Nasopharyngeal Swab  Result Value Ref Range Status   SARS Coronavirus 2 by RT PCR NEGATIVE NEGATIVE Final    Comment: (NOTE) SARS-CoV-2 target nucleic acids are NOT DETECTED. The SARS-CoV-2 RNA is generally detectable in upper respiratoy specimens during the acute phase of infection. The lowest concentration of SARS-CoV-2 viral copies this assay can detect is 131 copies/mL. A negative result does not preclude SARS-Cov-2 infection and should not be used as the sole basis for treatment or other patient management decisions. A negative result may occur with  improper specimen collection/handling, submission of specimen other than nasopharyngeal swab, presence of viral mutation(s) within the areas targeted by this assay, and inadequate number of viral copies (<131 copies/mL). A negative result must be combined with clinical observations, patient history, and epidemiological  information. The expected result is Negative. Fact Sheet for Patients:  12/28/19 Fact Sheet for Healthcare Providers:  https://www.moore.com/ This test is not yet ap proved or cleared by the https://www.young.biz/ FDA and  has been authorized for detection and/or diagnosis of SARS-CoV-2 by FDA under an Emergency Use Authorization (EUA). This EUA will remain  in effect (meaning  this test can be used) for the duration of the COVID-19 declaration under Section 564(b)(1) of the Act, 21 U.S.C. section 360bbb-3(b)(1), unless the authorization is terminated or revoked sooner.    Influenza A by PCR NEGATIVE NEGATIVE Final   Influenza B by PCR NEGATIVE NEGATIVE Final    Comment: (NOTE) The Xpert Xpress SARS-CoV-2/FLU/RSV assay is intended as an aid in  the diagnosis of influenza from Nasopharyngeal swab specimens and  should not be used as a sole basis for treatment. Nasal washings and  aspirates are unacceptable for Xpert Xpress SARS-CoV-2/FLU/RSV  testing. Fact Sheet for Patients: https://www.moore.com/ Fact Sheet for Healthcare Providers: https://www.young.biz/ This test is not yet approved or cleared by the [place] FDA and  has been authorized for detection and/or diagnosis of SARS-CoV-2 by  FDA under an Emergency Use Authorization (EUA). This EUA will remain  in effect (meaning this test can be used) for the duration of the  Covid-19 declaration under Section 564(b)(1) of the Act, 21  U.S.C. section 360bbb-3(b)(1), unless the authorization is  terminated or revoked. Performed at [facility], [address] [place], [place] [address]   Culture, blood (Routine x 2)     Status: None (Preliminary result)   Collection Time: 12/26/19  4:06 PM   Specimen: BLOOD  Result Value Ref Range Status   Specimen Description BLOOD RIGHT ANTECUBITAL  Final   Special Requests   Final    BOTTLES DRAWN  AEROBIC AND ANAEROBIC Blood Culture adequate volume   Culture   Final    NO GROWTH < 24 HOURS Performed at [facility], [address], [place], [place] [address]    Report Status PENDING  Incomplete  MRSA PCR Screening     Status: None   Collection Time: 12/27/19  6:08 AM   Specimen: Nasopharyngeal  Result Value Ref Range Status   MRSA by PCR NEGATIVE NEGATIVE Final    Comment:        The GeneXpert MRSA Assay (FDA approved for NASAL specimens only), is one component of a comprehensive MRSA colonization surveillance program. It is not intended to diagnose MRSA infection nor to guide or monitor treatment for MRSA infections. Performed at [facility], [address], [place], [place] [address]       Studies: No results found.  Scheduled Meds: . bupivacaine      . docusate sodium  100 mg Oral BID  . fentaNYL      . HYDROmorphone      . midazolam      . multivitamin with minerals  1 tablet Oral Daily  . nicotine  14 mg Transdermal Daily    Continuous Infusions: . methocarbamol (ROBAXIN) IV       LOS: 1 day     Darlin Drop, MD [facility] Pager [phone]  If 7PM-7AM, please contact night-coverage [email or web address] Password [facility] 12/27/2019, 3:34 PM

## 2019-12-27 NOTE — Procedures (Signed)
S/P L4 L5  fluoro guided disc aspiration. RT post lateral approach. Approx  1cc of aspirate obtained for analysis. S.Chara Marquard MD

## 2019-12-27 NOTE — Progress Notes (Signed)
Inpatient Diabetes Program Recommendations  AACE/ADA: New Consensus Statement on Inpatient Glycemic Control (2015)  Target Ranges:  Prepandial:   less than 140 mg/dL      Peak postprandial:   less than 180 mg/dL (1-2 hours)      Critically ill patients:  140 - 180 mg/dL   Lab Results  Component Value Date   GLUCAP 111 (H) 08/02/2013   HGBA1C 7.1 (H) 12/26/2019    Review of Glycemic Control  Diabetes history: New Diabetes Diagnosis this admission A1c 7.1%  Current orders for Inpatient glycemic control:  Novolog 0-9 units tid + hs  Just got back to room from IR procedure  Will order Living well with DM booklet for education. Glucose trends 119 right now not requiring insulin so far.  May benefit from Metformin 500 mg Daily at time of d/c with checking glucose 1-2 times a day, close follow up with PCP.  Pt will need glucometer at time of d/c.  Blood glucose meter kit order #[id number]  Will touch base with pt closer to time of d/c. Will follow trends for now.   Bedside RN and staff to teach pt how to perform CBG prior to d/c.  Thanks,  Tama Headings RN, MSN, BC-ADM Inpatient Diabetes Coordinator Team Pager [phone] (8a-5p)

## 2019-12-27 NOTE — Plan of Care (Signed)
  Problem: Education: Goal: Knowledge of General Education information will improve Description Including pain rating scale, medication(s)/side effects and non-pharmacologic comfort measures Outcome: Progressing   

## 2019-12-27 NOTE — Consult Note (Signed)
Chief Complaint: Patient was seen in consultation today for L4-5 discitis/aspiration.  Referring Physician(s): Jonah Blue  Supervising Physician: Julieanne Cotton  Patient Status: [facility] - In-pt  History of Present Illness: Karl Aguilar is a 49 y.o. male with a past medical history of nephrolithiasis. He was referred to [facility] ED 12/26/2019 by Dr. Farris Has secondary to progressive low back pain x months, with MR lumbar spine 12/24/2019 revealing L4-5 discitis/osteomyelitis. He was admitted for further management. ID was consulted who recommended NIR consultation for possible L4-5 disc aspiration to obtain sampling prior to initiation of antibiotics.  MR lumbar spine 12/24/2019: 1. Findings consistent with L4-L5 discitis/osteomyelitis. 2. L4-5 mild spinal canal stenosis, effacement of the left subarticular zone with possible impingement of the traversing left L5 nerve root. 3. L5-S1 right central disc protrusion contacting the traversing right S1 nerve root.  IR requested by Dr. Ophelia Charter for possible image-guided L4-5 disc aspiration. Patient awake and alert sitting in chair. Accompanied by wife and daughter. Complains of low back pain, rated 6/10 at this time. States pain is rated 10/10 when laying flat. Denies fever, chills, chest pain, dyspnea, abdominal pain, or headache.   Past Medical History:  Diagnosis Date  . History of kidney stones     Past Surgical History:  Procedure Laterality Date  . APPENDECTOMY    . CHOLECYSTECTOMY N/A 08/02/2013   Procedure: LAPAROSCOPIC CHOLECYSTECTOMY;  Surgeon: Dalia Heading, MD;  Location: [facility] ORS;  Service: General;  Laterality: N/A;  . RADIOLOGY WITH ANESTHESIA N/A 12/24/2019   Procedure: MRI LUMBER SPINE WITHOUT CONTRAST;  Surgeon: Radiologist, Medication, MD;  Location: [facility] OR;  Service: Radiology;  Laterality: N/A;    Allergies: Asa [aspirin]  Medications: Prior to Admission medications   Medication Sig Start Date End Date Taking?  Authorizing Provider  ibuprofen (ADVIL) 200 MG tablet Take 800 mg by mouth every 4 (four) hours as needed (for pain).    Yes [provider]     History reviewed. No pertinent family history.  Social History   Socioeconomic History  . Marital status: Married    Spouse name: Not on file  . Number of children: Not on file  . Years of education: Not on file  . Highest education level: Not on file  Occupational History  . Occupation: Retail banker  Tobacco Use  . Smoking status: Current Every Day Smoker    Packs/day: 1.00    Years: 32.00    Pack years: 32.00    Types: Cigarettes  . Smokeless tobacco: Never Used  Substance and Sexual Activity  . Alcohol use: Yes    Comment: occasional  . Drug use: No  . Sexual activity: Yes    Birth control/protection: None  Other Topics Concern  . Not on file  Social History Narrative  . Not on file   Social Determinants of Health   Financial Resource Strain:   . Difficulty of Paying Living Expenses:   Food Insecurity:   . Worried About Programme researcher, broadcasting/film/video in the Last Year:   . Barista in the Last Year:   Transportation Needs:   . Freight forwarder (Medical):   Marland Kitchen Lack of Transportation (Non-Medical):   Physical Activity:   . Days of Exercise per Week:   . Minutes of Exercise per Session:   Stress:   . Feeling of Stress :   Social Connections:   . Frequency of Communication with Friends and Family:   . Frequency of Social Gatherings with  Friends and Family:   . Attends Religious Services:   . Active Member of Clubs or Organizations:   . Attends Archivist Meetings:   Marland Kitchen Marital Status:      Review of Systems: A 12 point ROS discussed and pertinent positives are indicated in the HPI above.  All other systems are negative.  Review of Systems  Constitutional: Negative for chills and fever.  Respiratory: Negative for shortness of breath and wheezing.   Cardiovascular: Negative for chest pain  and palpitations.  Gastrointestinal: Negative for abdominal pain.  Musculoskeletal: Positive for back pain.  Neurological: Negative for headaches.  Psychiatric/Behavioral: Negative for behavioral problems and confusion.    Vital Signs: BP (!) 142/77 (BP Location: Left Arm)   Pulse 87   Temp 97.8 F (36.6 C) (Oral)   Resp 18   Ht 5\' 11"  (1.803 m)   Wt 188 lb (85.3 kg)   SpO2 97%   BMI 26.22 kg/m   Physical Exam Vitals and nursing note reviewed.  Constitutional:      General: He is not in acute distress.    Appearance: Normal appearance.  Cardiovascular:     Rate and Rhythm: Normal rate and regular rhythm.     Heart sounds: Normal heart sounds. No murmur.  Pulmonary:     Effort: Pulmonary effort is normal. No respiratory distress.     Breath sounds: Normal breath sounds. No wheezing.  Musculoskeletal:     Comments: Moderate-severe midline low back tenderness.  Skin:    General: Skin is warm and dry.  Neurological:     Mental Status: He is alert and oriented to person, place, and time.      MD Evaluation Airway: WNL Heart: WNL Abdomen: WNL Chest/ Lungs: WNL ASA  Classification: 2 Mallampati/Airway Score: One   Imaging: DG Chest 2 View  Result Date: 12/26/2019 CLINICAL DATA:  Lower back pain, possible sepsis. EXAM: CHEST - 2 VIEW COMPARISON:  July 28, 2013. FINDINGS: The heart size and mediastinal contours are within normal limits. Both lungs are clear. No pneumothorax or pleural effusion is noted. The visualized skeletal structures are unremarkable. IMPRESSION: No active cardiopulmonary disease. Electronically Signed   By: Marijo Conception M.D.   On: 12/26/2019 12:27   MR LUMBAR SPINE WO CONTRAST  Result Date: 12/24/2019 CLINICAL DATA:  Severe back pain. EXAM: MRI LUMBAR SPINE WITHOUT CONTRAST TECHNIQUE: Multiplanar, multisequence MR imaging of the lumbar spine was performed. No intravenous contrast was administered. COMPARISON:  None. FINDINGS: Segmentation:   Standard. Alignment:  Physiologic. Vertebrae: Prominent marrow edema involving the L4 and L5 vertebral bodies extending to the bilateral L5 pedicles with associated erosion of the opposing endplates and increased T2 signal of the intervening disc. Findings are consistent with discitis/osteomyelitis. No fracture or evidence of aggressive bone lesion. Conus medullaris and cauda equina: Conus extends to the T12-L1 level. Conus and cauda equina appear normal. Paraspinal and other soft tissues: Small right renal cyst. Edema of the periarticular soft tissues about the left L4-5 facet joint. Disc levels: T12-L1: No spinal canal or neural foraminal stenosis. L1-2: No spinal canal or neural foraminal stenosis. L2-3: No spinal canal or neural foraminal stenosis. L3-4: Shallow disc bulge, mild facet degenerative changes with bilateral joint effusion without significant spinal canal or neural foraminal stenosis. L4-5: Increased T2 signal of the disc which show mild bulging and associated left subarticular disc protrusion, bilateral facet effusion and mild ligamentum flavum redundancy. Findings result in mild spinal canal stenosis with effacement of the  left subarticular zone which may compress on the traversing left L5 nerve root. There is moderate bilateral neural foraminal narrowing. L5-S1: Right central disc protrusion causing small indentation of the thecal sac and contacting the traversing right S1 nerve root. No spinal canal or neural foraminal stenosis. IMPRESSION: 1. Findings consistent with L4-L5 discitis/osteomyelitis. 2. L4-5 mild spinal canal stenosis, effacement of the left subarticular zone with possible impingement of the traversing left L5 nerve root. 3. L5-S1 right central disc protrusion contacting the traversing right S1 nerve root. Electronically Signed   By: Baldemar Lenis M.D.   On: 12/24/2019 11:36    Labs:  CBC: Recent Labs    12/26/19 1154  WBC 9.0  HGB 11.8*  HCT 38.2*  PLT  333    COAGS: Recent Labs    12/26/19 1154  INR 1.0    BMP: Recent Labs    12/24/19 0856 12/26/19 1154 12/27/19 0548  NA 136 137 136  K 3.9 3.7 3.7  CL 104 100 99  CO2 22 21* 26  GLUCOSE 117* 119* 119*  BUN 10 10 11   CALCIUM 9.6 9.6 9.6  CREATININE 1.08 1.11 1.10  GFRNONAA >60 >60 >60  GFRAA >60 >60 >60    LIVER FUNCTION TESTS: Recent Labs    12/26/19 1154  BILITOT 0.7  AST 16  ALT 35  ALKPHOS 115  PROT 7.3  ALBUMIN 3.2*     Assessment and Plan:  L4-5 discitis/osteomyelitis, seen on MR lumbar spine 12/24/2019. Plan for image-guided L4-5 disc aspiration today in IR with Dr. 12/26/2019. Case has been reviewed by Dr. Corliss Skains who approves procedure. Patient is NPO. Afebrile. He does not take blood thinners. INR 1.0 12/26/2019.  Risks and benefits discussed with the patient including, but not limited to bleeding, infection, damage to adjacent structures or low yield requiring additional tests. All of the patient's questions were answered, patient is agreeable to proceed. Consent signed and in IR control room.   Thank you for this interesting consult.  I greatly enjoyed meeting ZEIN HELBING and look forward to participating in their care.  A copy of this report was sent to the requesting provider on this date.  Electronically Signed: Ladona Mow, PA-C 12/27/2019, 9:29 AM   I spent a total of 40 Minutes in face to face in clinical consultation, greater than 50% of which was counseling/coordinating care for L4-5 discitis/aspiration.

## 2019-12-27 NOTE — Progress Notes (Signed)
Initial Nutrition Assessment  DOCUMENTATION CODES:   Not applicable  INTERVENTION:   -MVI with minerals daily -Magic cup TID with meals, each supplement provides 290 kcal and 9 grams of protein  NUTRITION DIAGNOSIS:   Increased nutrient needs related to acute illness(osteomyelitis) as evidenced by estimated needs.  GOAL:   Patient will meet greater than or equal to 90% of their needs  MONITOR:   PO intake, Supplement acceptance, Diet advancement, Labs, Weight trends, Skin, I & O's  REASON FOR ASSESSMENT:   Consult Assessment of nutrition requirement/status  ASSESSMENT:   Karl Aguilar is a 49 y.o. male without significant past medical history presenting with severe back pain.  His back "just hurts."  This time, it has bene hurting since the end of January.  No specific injury.  He injured it in his 25s initially, bulging discs and DDD.  Pt admitted with lt spine osteomyelitis.   Reviewed I/O's: +1 L x 24 hours  Plan for IR drainage today (L4-L5 discitis). Pt currently NPO for procedure.   Pt sitting in recliner chair, pleasant and in good spirits today. He joked with this RD that he is hungry, but watching cooking shows on TV. PTA pt has a great appetite he typically does not eat breakfast but often eats hearty meals (meat, starch, and vegetables) for lunch and dinner. He understands reason for NPO status, but looking forward to eating when he can ("my wife and daughter are [employer] get me something good").   Pt reports he has maintaining a wt of around 188# over the past several months. He shares he experienced a 20# wt loss over a 5 day period in January 2021, "because I couldn't eat anything because of my back pain". Pt shares that he is still able to perform ADLs, but finds it more difficult to do so, as he now also experiences knee pain and has been using a crutch over the past 1-2 months to help with ambulation.   Discussed importance of good meal and supplement intake to  promote healing.   Medications reviewed and include colace and methocarbamol.   Labs reviewed.   NUTRITION - FOCUSED PHYSICAL EXAM:    Most Recent Value  Orbital Region  No depletion  Upper Arm Region  No depletion  Thoracic and Lumbar Region  No depletion  Buccal Region  No depletion  Temple Region  No depletion  Clavicle Bone Region  No depletion  Clavicle and Acromion Bone Region  No depletion  Scapular Bone Region  No depletion  Dorsal Hand  No depletion  Patellar Region  Mild depletion  Anterior Thigh Region  Mild depletion  Posterior Calf Region  Mild depletion  Edema (RD Assessment)  None  Hair  Reviewed  Eyes  Reviewed  Mouth  Reviewed  Skin  Reviewed  Nails  Reviewed       Diet Order:   Diet Order            Diet NPO time specified  Diet effective midnight              EDUCATION NEEDS:   Education needs have been addressed  Skin:  Skin Assessment: Reviewed RN Assessment  Last BM:  12/24/19  Height:   Ht Readings from Last 1 Encounters:  12/26/19 5\' 11"  (1.803 m)    Weight:   Wt Readings from Last 1 Encounters:  12/26/19 85.3 kg    Ideal Body Weight:  78.2 kg  BMI:  Body mass index is  26.22 kg/m.  Estimated Nutritional Needs:   Kcal:  2150-2350  Protein:  115-130 grams  Fluid:  > 2.1 L    Levada Schilling, RD, LDN, CDCES Registered Dietitian II Certified Diabetes Care and Education Specialist Please refer to [facility] for RD and/or RD on-call/weekend/after hours pager

## 2019-12-28 ENCOUNTER — Inpatient Hospital Stay: Payer: Self-pay

## 2019-12-28 DIAGNOSIS — M4646 Discitis, unspecified, lumbar region: Secondary | ICD-10-CM | POA: Diagnosis not present

## 2019-12-28 DIAGNOSIS — M4626 Osteomyelitis of vertebra, lumbar region: Secondary | ICD-10-CM | POA: Diagnosis not present

## 2019-12-28 DIAGNOSIS — F17200 Nicotine dependence, unspecified, uncomplicated: Secondary | ICD-10-CM | POA: Diagnosis not present

## 2019-12-28 LAB — CBC WITH DIFFERENTIAL/PLATELET
Abs Immature Granulocytes: 0.04 10*3/uL (ref 0.00–0.07)
Basophils Absolute: 0.1 10*3/uL (ref 0.0–0.1)
Basophils Relative: 1 %
Eosinophils Absolute: 0.2 10*3/uL (ref 0.0–0.5)
Eosinophils Relative: 2 %
HCT: 33.3 % — ABNORMAL LOW (ref 39.0–52.0)
Hemoglobin: 10.6 g/dL — ABNORMAL LOW (ref 13.0–17.0)
Immature Granulocytes: 1 %
Lymphocytes Relative: 18 %
Lymphs Abs: 1.5 10*3/uL (ref 0.7–4.0)
MCH: 27.7 pg (ref 26.0–34.0)
MCHC: 31.8 g/dL (ref 30.0–36.0)
MCV: 86.9 fL (ref 80.0–100.0)
Monocytes Absolute: 0.8 10*3/uL (ref 0.1–1.0)
Monocytes Relative: 10 %
Neutro Abs: 5.7 10*3/uL (ref 1.7–7.7)
Neutrophils Relative %: 68 %
Platelets: 284 10*3/uL (ref 150–400)
RBC: 3.83 MIL/uL — ABNORMAL LOW (ref 4.22–5.81)
RDW: 14.3 % (ref 11.5–15.5)
WBC: 8.3 10*3/uL (ref 4.0–10.5)
nRBC: 0 % (ref 0.0–0.2)

## 2019-12-28 LAB — GLUCOSE, CAPILLARY
Glucose-Capillary: 138 mg/dL — ABNORMAL HIGH (ref 70–99)
Glucose-Capillary: 93 mg/dL (ref 70–99)
Glucose-Capillary: 159 mg/dL — ABNORMAL HIGH (ref 70–99)

## 2019-12-28 LAB — BASIC METABOLIC PANEL
Anion gap: 11 (ref 5–15)
BUN: 12 mg/dL (ref 6–20)
CO2: 23 mmol/L (ref 22–32)
Calcium: 9.2 mg/dL (ref 8.9–10.3)
Chloride: 99 mmol/L (ref 98–111)
Creatinine, Ser: 1 mg/dL (ref 0.61–1.24)
GFR calc Af Amer: >60 mL/min (ref >60)
GFR calc non Af Amer: >60 mL/min (ref >60)
Glucose, Bld: 125 mg/dL — ABNORMAL HIGH (ref 70–99)
Potassium: 4 mmol/L (ref 3.5–5.1)
Sodium: 133 mmol/L — ABNORMAL LOW (ref 135–145)

## 2019-12-28 LAB — LACTIC ACID, PLASMA: Lactic Acid, Venous: 0.9 mmol/L (ref 0.5–1.9)

## 2019-12-28 LAB — PROCALCITONIN: Procalcitonin: 0.29 ng/mL

## 2019-12-28 MED ORDER — SODIUM CHLORIDE 0.9 % IV SOLN
2.0000 g | INTRAVENOUS | Status: DC
  Administered 2019-12-28: 2 g via INTRAVENOUS
  Filled 2019-12-28: qty 20

## 2019-12-28 MED ORDER — SODIUM CHLORIDE 0.9% FLUSH
10.0000 mL | INTRAVENOUS | Status: DC | PRN
  Administered 2019-12-29: 10 mL

## 2019-12-28 MED ORDER — VANCOMYCIN HCL 1250 MG/250ML IV SOLN
1250.0000 mg | Freq: Two times a day (BID) | INTRAVENOUS | Status: DC
  Administered 2019-12-28 – 2019-12-29 (×2): 1250 mg via INTRAVENOUS
  Filled 2019-12-28 (×3): qty 250

## 2019-12-28 MED ORDER — VANCOMYCIN HCL 2000 MG/400ML IV SOLN
2000.0000 mg | Freq: Once | INTRAVENOUS | Status: AC
  Administered 2019-12-28: 2000 mg via INTRAVENOUS
  Filled 2019-12-28: qty 400

## 2019-12-28 MED ORDER — METHOCARBAMOL 500 MG PO TABS
500.0000 mg | ORAL_TABLET | Freq: Four times a day (QID) | ORAL | Status: DC | PRN
  Administered 2019-12-28 – 2019-12-29 (×2): 500 mg via ORAL
  Filled 2019-12-28 (×2): qty 1

## 2019-12-28 MED ORDER — SODIUM CHLORIDE 0.9% FLUSH
10.0000 mL | Freq: Two times a day (BID) | INTRAVENOUS | Status: DC
  Administered 2019-12-28 – 2019-12-29 (×3): 10 mL

## 2019-12-28 MED ORDER — CEFAZOLIN SODIUM-DEXTROSE 2-4 GM/100ML-% IV SOLN
2.0000 g | Freq: Three times a day (TID) | INTRAVENOUS | Status: DC
  Administered 2019-12-28 – 2019-12-29 (×3): 2 g via INTRAVENOUS
  Filled 2019-12-28 (×3): qty 100

## 2019-12-28 MED ORDER — CHLORHEXIDINE GLUCONATE CLOTH 2 % EX PADS
6.0000 | MEDICATED_PAD | Freq: Every day | CUTANEOUS | Status: DC
  Administered 2019-12-28 – 2019-12-29 (×2): 6 via TOPICAL

## 2019-12-28 NOTE — Progress Notes (Signed)
PROGRESS NOTE  Karl Aguilar:[id number] DOB: 30-Apr-1971 DOA: 12/26/2019 PCP: Elfredia Nevins, MD  HPI/Recap of past 24 hours: Karl Aguilar is a 49 y.o. male without significant past medical history presenting with severe back pain.  His back "just hurts."  This time, it has bene hurting since the end of 10-07-22.  No specific injury.  He injured it in his 32s initially, bulging discs and DDD.  He has never had surgery on it.  He has always done PT and been able to recover.  His last episode prior was in 10/2018 and he was out for a couple of weeks; PT helped and he returned to regular life.  The pain awoke him from a dead sleep in 07-Oct-2022 - unable to get out of bed, hurt to move, lie down, sit up, stand up.  He couldn't eat/drink for 4 days.  It finally eased enough to allow him a little mobility.  It was diffusely across the low back (this was different because it was on his left lower back).  It got better and he was able to walk in his driveway; he thinks increased ROM exercises made it better into February.  Toward the middle of March, it started getting worse again.  He does have L knee pain.  Right before his back started hurting he had a pustule on his left knee; had I&D and took oral antibiotics and used a cream for it.  His knee also swelled up but erythema and warmth to touch.  As he started walking more, his knee started swelling more.  He was seeing Dr. Farris Has periodically for his back; he has claustrophobia and so they scheduled him for an MRI under sedation.  He currently cannot lie flat, can only sit and stand.  He sleeps in a recliner.  Transient radiculopathy, not present currently.  No fevers since Oct 07, 2022.  At the very beginning, he had temp up to 102 for maybe a week.    ED Course:  Presenting with acute on chronic back pain.  MRI with L4-5 discitis.  Dr. Farris Has suggests ID/IR consults.  ID says no antibiotics.  IR to biopsy.  S/p L4-L5 fluoroscopy guided disc aspiration by IR,  sent for analysis on 12/27/2019.  12/28/19: reports back pain when he gets up too fast.  No other complaints.  Deep body culture growing few Staph aureus.  On IV vancomycin and Rocephin as recommended by infectious disease.    Assessment/Plan: Principal Problem:   Acute osteomyelitis of lumbar spine (HCC) Active Problems:   Tobacco dependence  L4-L5 discitis with unclear source No evidence of systemic infection, no leukocytosis and afebrile. Sed rate 80, CRP 7.1 Status post L4-L5 fluoroscopy guided cyst aspiration, body fluid growing few staph aureus MRSA screening test negative Blood cultures negative to date Continue to follow cultures Started IV vancomycin and Rocephin day #1 Continue pain control with bowel regimen as needed  Suspected type 2 diabetes Not on oral hypoglycemics prior to admission Hemoglobin A1c 7.1 Continueinsulin sliding scale LDL 119 Diabetes coordinator to educate Likely will switch to Metformin at discharge.  Elevated BP suspect contributed by pain Blood pressure now at goal IV hydralazine 5 mg every 4 hours as needed for SBP greater than 160 Continue to monitor vital signs If persistent will add Norvasc  Transient sinus tachycardia Now resolved, TSH is normal Continue gentle IV fluid hydration Continue to monitor vital signs  Ambulatory dysfunction in the setting of chronic back pain PT OT to  assess for recommendation  Asymptomatic pyuria UA positive for pyuria Urine culture pending  Tobacco use disorder Nicotine patch as needed   DVT prophylaxis: SCDs Code Status:  Full - confirmed with patient/family Family Communication: Wife was present throughout evaluation Disposition Plan:The patient is from: home             [facility]ated [facility]             Anticipated d/c date will depend on clinical response to treatment, likely sometime early next week              Patient is currently: acutely  ill Consults called: ID; orthopedics; IR; nutrition     Objective: Vitals:   12/27/19 2003 12/28/19 0359 12/28/19 0742 12/28/19 1449  BP: 133/80 120/82 139/71 128/74  Pulse: 90 100 (!) 102 97  Resp: 20 18 17 17   Temp: 99.3 F (37.4 C) 99.9 F (37.7 C) 98.2 F (36.8 C) 98.4 F (36.9 C)  TempSrc: Oral Oral Oral Oral  SpO2: 94% 95% 98% 100%  Weight:      Height:        Intake/Output Summary (Last 24 hours) at 12/28/2019 1521 Last data filed at 12/28/2019 1503 Gross per 24 hour  Intake 1217.78 ml  Output --  Net 1217.78 ml   Filed Weights   12/26/19 1149  Weight: 85.3 kg    Exam:  . General: 49 y.o. year-old male well-developed well-nourished in no acute distress.  Alert oriented x3.   . Cardiovascular: Regular rate and rhythm no rubs or gallops. Marland Kitchen. Respiratory: Clear to station no wheezes or rales.   . Abdomen: Soft nontender normal bowel sounds present. . Musculoskeletal: No lower extremity edema bilaterally. Marland Kitchen. Psychiatry: Mood is appropriate for condition and setting   Data Reviewed: CBC: Recent Labs  Lab 12/26/19 1154 12/27/19 1214 12/28/19 0648  WBC 9.0 6.3 8.3  NEUTROABS 5.4  --  5.7  HGB 11.8* 11.4* 10.6*  HCT 38.2* 35.3* 33.3*  MCV 89.3 86.7 86.9  PLT 333 253 284   Basic Metabolic Panel: Recent Labs  Lab 12/24/19 0856 12/26/19 1154 12/27/19 0548 12/28/19 0648  NA 136 137 136 133*  K 3.9 3.7 3.7 4.0  CL 104 100 99 99  CO2 22 21* 26 23  GLUCOSE 117* 119* 119* 125*  BUN 10 10 11 12   CREATININE 1.08 1.11 1.10 1.00  CALCIUM 9.6 9.6 9.6 9.2   GFR: Estimated Creatinine Clearance: 95.2 mL/min (by C-G formula based on SCr of 1 mg/dL). Liver Function Tests: Recent Labs  Lab 12/26/19 1154  AST 16  ALT 35  ALKPHOS 115  BILITOT 0.7  PROT 7.3  ALBUMIN 3.2*   No results for input(s): LIPASE, AMYLASE in the last 168 hours. No results for input(s): AMMONIA in the last 168 hours. Coagulation Profile: Recent Labs  Lab 12/26/19 1154  INR 1.0    Cardiac Enzymes: No results for input(s): CKTOTAL, CKMB, CKMBINDEX, TROPONINI in the last 168 hours. BNP (last 3 results) No results for input(s): PROBNP in the last 8760 hours. HbA1C: Recent Labs    12/26/19 2115  HGBA1C 7.1*   CBG: Recent Labs  Lab 12/27/19 1659 12/27/19 2137 12/28/19 0739 12/28/19 1112  GLUCAP 140* 146* 138* 93   Lipid Profile: Recent Labs    12/27/19 1638  CHOL 180  HDL 45  LDLCALC 119*  TRIG 80  CHOLHDL 4.0   Thyroid Function Tests: Recent Labs  12/27/19 1638  TSH 1.527   Anemia Panel: No results for input(s): VITAMINB12, FOLATE, FERRITIN, TIBC, IRON, RETICCTPCT in the last 72 hours. Urine analysis:    Component Value Date/Time   COLORURINE YELLOW 12/26/2019 1644   APPEARANCEUR CLEAR 12/26/2019 1644   LABSPEC 1.012 12/26/2019 1644   PHURINE 5.0 12/26/2019 1644   GLUCOSEU NEGATIVE 12/26/2019 1644   HGBUR SMALL (A) 12/26/2019 1644   BILIRUBINUR NEGATIVE 12/26/2019 1644   KETONESUR NEGATIVE 12/26/2019 1644   PROTEINUR NEGATIVE 12/26/2019 1644   NITRITE POSITIVE (A) 12/26/2019 1644   LEUKOCYTESUR MODERATE (A) 12/26/2019 1644   Sepsis Labs: @LABRCNTIP (procalcitonin:4,lacticidven:4)  ) Recent Results (from the past 240 hour(s))  SARS CORONAVIRUS 2 (TAT 6-24 HRS) Nasopharyngeal Nasopharyngeal Swab     Status: None   Collection Time: 12/21/19  1:44 PM   Specimen: Nasopharyngeal Swab  Result Value Ref Range Status   SARS Coronavirus 2 NEGATIVE NEGATIVE Final    Comment: (NOTE) SARS-CoV-2 target nucleic acids are NOT DETECTED. The SARS-CoV-2 RNA is generally detectable in upper and lower respiratory specimens during the acute phase of infection. Negative results do not preclude SARS-CoV-2 infection, do not rule out co-infections with other pathogens, and should not be used as the sole basis for treatment or other patient management decisions. Negative results must be combined with clinical observations, patient history, and  epidemiological information. The expected result is Negative. Fact Sheet for Patients: 12/23/19 Fact Sheet for Healthcare Providers: HairSlick.no This test is not yet approved or cleared by the quierodirigir.com FDA and  has been authorized for detection and/or diagnosis of SARS-CoV-2 by FDA under an Emergency Use Authorization (EUA). This EUA will remain  in effect (meaning this test can be used) for the duration of the COVID-19 declaration under Section 56 4(b)(1) of the Act, 21 U.S.C. section 360bbb-3(b)(1), unless the authorization is terminated or revoked sooner. Performed at [facility], [address] [place], [place]   Culture, blood (Routine x 2)     Status: None (Preliminary result)   Collection Time: 12/26/19 11:55 AM   Specimen: BLOOD  Result Value Ref Range Status   Specimen Description BLOOD RIGHT ANTECUBITAL  Final   Special Requests   Final    BOTTLES DRAWN AEROBIC AND ANAEROBIC Blood Culture adequate volume   Culture   Final    NO GROWTH 2 DAYS Performed at [facility], [address] [place], [place]    Report Status PENDING  Incomplete  Respiratory Panel by RT PCR (Flu A&B, Covid) - Nasopharyngeal Swab     Status: None   Collection Time: 12/26/19  1:46 PM   Specimen: Nasopharyngeal Swab  Result Value Ref Range Status   SARS Coronavirus 2 by RT PCR NEGATIVE NEGATIVE Final    Comment: (NOTE) SARS-CoV-2 target nucleic acids are NOT DETECTED. The SARS-CoV-2 RNA is generally detectable in upper respiratoy specimens during the acute phase of infection. The lowest concentration of SARS-CoV-2 viral copies this assay can detect is 131 copies/mL. A negative result does not preclude SARS-Cov-2 infection and should not be used as the sole basis for treatment or other patient management decisions. A negative result may occur with  improper specimen collection/handling,  submission of specimen other than nasopharyngeal swab, presence of viral mutation(s) within the areas targeted by this assay, and inadequate number of viral copies (<131 copies/mL). A negative result must be combined with clinical observations, patient history, and epidemiological information. The expected result is Negative. Fact Sheet for  Patients:  https://www.moore.com/ Fact Sheet for Healthcare Providers:  https://www.young.biz/ This test is not yet ap proved or cleared by the [place] and  has been authorized for detection and/or diagnosis of SARS-CoV-2 by FDA under an Emergency Use Authorization (EUA). This EUA will remain  in effect (meaning this test can be used) for the duration of the COVID-19 declaration under Section 564(b)(1) of the Act, 21 U.S.C. section 360bbb-3(b)(1), unless the authorization is terminated or revoked sooner.    Influenza A by PCR NEGATIVE NEGATIVE Final   Influenza B by PCR NEGATIVE NEGATIVE Final    Comment: (NOTE) The Xpert Xpress SARS-CoV-2/FLU/RSV assay is intended as an aid in  the diagnosis of influenza from Nasopharyngeal swab specimens and  should not be used as a sole basis for treatment. Nasal washings and  aspirates are unacceptable for Xpert Xpress SARS-CoV-2/FLU/RSV  testing. Fact Sheet for Patients: https://www.moore.com/ Fact Sheet for Healthcare Providers: https://www.young.biz/ This test is not yet approved or cleared by the [place] FDA and  has been authorized for detection and/or diagnosis of SARS-CoV-2 by  FDA under an Emergency Use Authorization (EUA). This EUA will remain  in effect (meaning this test can be used) for the duration of the  Covid-19 declaration under Section 564(b)(1) of the Act, 21  U.S.C. section 360bbb-3(b)(1), unless the authorization is  terminated or revoked. Performed at [facility], [address],  [place], [place] [address]   Culture, blood (Routine x 2)     Status: None (Preliminary result)   Collection Time: 12/26/19  4:06 PM   Specimen: BLOOD  Result Value Ref Range Status   Specimen Description BLOOD RIGHT ANTECUBITAL  Final   Special Requests   Final    BOTTLES DRAWN AEROBIC AND ANAEROBIC Blood Culture adequate volume   Culture   Final    NO GROWTH 2 DAYS Performed at [facility], [address], [place], [place] [address]    Report Status PENDING  Incomplete  MRSA PCR Screening     Status: None   Collection Time: 12/27/19  6:08 AM   Specimen: Nasopharyngeal  Result Value Ref Range Status   MRSA by PCR NEGATIVE NEGATIVE Final    Comment:        The GeneXpert MRSA Assay (FDA approved for NASAL specimens only), is one component of a comprehensive MRSA colonization surveillance program. It is not intended to diagnose MRSA infection nor to guide or monitor treatment for MRSA infections. Performed at [facility], [address] [place], [place] [address]   Aerobic/Anaerobic Culture (surgical/deep wound)     Status: None (Preliminary result)   Collection Time: 12/27/19  3:19 PM   Specimen: PATH Disc; Body Fluid  Result Value Ref Range Status   Specimen Description WOUND  Final   Special Requests DISC ASPIRATE L4 L5  Final   Gram Stain   Final    NO WBC SEEN FEW GRAM POSITIVE COCCI Performed at [facility], [address], [place], [place] [address]    Culture FEW STAPHYLOCOCCUS AUREUS  Final   Report Status PENDING  Incomplete      Studies: [place] EKG SITE RITE  Result Date: 12/28/2019 If Site Rite image not attached, placement could not be confirmed due to current cardiac rhythm.   Scheduled Meds: . Chlorhexidine Gluconate Cloth  6 each Topical Daily  . docusate sodium  100 mg Oral BID  . insulin aspart  0-5 Units Subcutaneous QHS  .  insulin aspart  0-9 Units Subcutaneous TID WC  . living well with diabetes book   Does not apply Once  .  multivitamin with minerals  1 tablet Oral Daily  . nicotine  14 mg Transdermal Daily  . sodium chloride flush  10-40 mL Intracatheter Q12H    Continuous Infusions: .  ceFAZolin (ANCEF) IV    . lactated ringers 50 mL/hr at 12/28/19 1503  . vancomycin       LOS: 2 days     Kayleen Memos, MD [facility] Pager [phone]  If 7PM-7AM, please contact night-coverage [email or web address] Password [email or web address] 12/28/2019, 3:21 PM

## 2019-12-28 NOTE — Progress Notes (Addendum)
Pharmacy Antibiotic Note  Karl Aguilar is a 49 y.o. male admitted on 12/26/2019 with severe back pain and found to have osteomyelitis/discitis.  Pharmacy has been consulted for vancomycin dosing. **Addendum: Pharmacy has now also been consulted for cefazolin dosing**  Patient afebrile, WBC wnl, lactate trending down to 0.9  Plan: D/c ceftriazone Cefazolin 2g IV q8h Vancomycin 2g IV x1 then vancomycin 1250mg  IV q12h (estAUC 488 with SCr 1.0 and goal AUC 400-550) Monitor clinical picture, renal function, vanc levels at Css F/U C&S, abx de-escalation, LOT  Height: 5\' 11"  (180.3 cm) Weight: 85.3 kg (188 lb) IBW/kg (Calculated) : 75.3  Temp (24hrs), Avg:99.1 F (37.3 C), Min:98.2 F (36.8 C), Max:99.9 F (37.7 C)  Recent Labs  Lab 12/24/19 0856 12/26/19 1154 12/26/19 1611 12/27/19 0548 12/27/19 1214 12/28/19 0648  WBC  --  9.0  --   --  6.3 8.3  CREATININE 1.08 1.11  --  1.10  --  1.00  LATICACIDVEN  --  2.7* 1.3  --   --  0.9    Estimated Creatinine Clearance: 95.2 mL/min (by C-G formula based on SCr of 1 mg/dL).    Allergies  Allergen Reactions  . Asa [Aspirin] Nausea And Vomiting    Antimicrobials this admission: CTX 5/1 x1 Cefazolin 5/1> vanc 5/1>   Microbiology results: 4/29 BCx: ngtd 4/30 disc aspirate: pending (few staph aureus) 4/30 MRSA PCR: neg    5/30, PharmD PGY2 Pharmacy Resident Phone [phone]  12/28/2019   9:36 AM

## 2019-12-28 NOTE — Plan of Care (Signed)

## 2019-12-28 NOTE — Progress Notes (Signed)
  Peripherally Inserted Central Catheter Placement  The IV Nurse has discussed with the patient and/or persons authorized to consent for the patient, the purpose of this procedure and the potential benefits and risks involved with this procedure.  The benefits include less needle sticks, lab draws from the catheter, and the patient may be discharged home with the catheter. Risks include, but not limited to, infection, bleeding, blood clot (thrombus formation), and puncture of an artery; nerve damage and irregular heartbeat and possibility to perform a PICC exchange if needed/ordered by physician.  Alternatives to this procedure were also discussed.  Bard Power PICC patient education guide, fact sheet on infection prevention and patient information card has been provided to patient /or left at bedside.    PICC Placement Documentation  PICC Single Lumen 12/28/19 PICC Left Basilic 43 cm 0 cm (Active)  Indication for Insertion or Continuance of Line Home intravenous therapies (PICC only) 12/28/19 1400  Exposed Catheter (cm) 0 cm 12/28/19 1400  Site Assessment Clean;Dry;Intact 12/28/19 1400  Line Status Flushed;Blood return noted;Saline locked 12/28/19 1400  Dressing Type Transparent 12/28/19 1400  Dressing Status Clean;Dry;Intact;Antimicrobial disc in place 12/28/19 1400  Safety Lock Not Applicable 12/28/19 1400  Line Care Connections checked and tightened 12/28/19 1400  Line Adjustment (NICU/IV Team Only) No 12/28/19 1400  Dressing Change Due 01/04/20 12/28/19 1400       Kenly Xiao III, Arnetha Massy 12/28/2019, 2:17 PM

## 2019-12-28 NOTE — Progress Notes (Signed)
Two site rite pictures saved in imaging. The second confirmation picture, indicating PICC length of 43 cm, is correct and indicated in the IV assessment of Flowsheets.

## 2019-12-28 NOTE — Consult Note (Signed)
Date of Admission:  12/26/2019          Reason for Consult: Lumbar discitis   Referring Provider: Dr Nevada Crane   Assessment:  1. Lumbar diskitis due to Staph aureus 2. History of soft tissue infection in his left knee in December 3. IDDM   Plan:  1. Start vancomycin and cefazolin 2. He will need a 6-week course of IV antibiotics 3. Check baseline sed rate CRP  Principal Problem:   Acute osteomyelitis of lumbar spine (HCC) Active Problems:   Tobacco dependence   Scheduled Meds: . docusate sodium  100 mg Oral BID  . insulin aspart  0-5 Units Subcutaneous QHS  . insulin aspart  0-9 Units Subcutaneous TID WC  . living well with diabetes book   Does not apply Once  . multivitamin with minerals  1 tablet Oral Daily  . nicotine  14 mg Transdermal Daily   Continuous Infusions: . lactated ringers 50 mL/hr at 12/27/19 1832  . methocarbamol (ROBAXIN) IV    . vancomycin    . vancomycin 2,000 mg (12/28/19 1119)   PRN Meds:.acetaminophen **OR** acetaminophen, bisacodyl, hydrALAZINE, HYDROcodone-acetaminophen, ibuprofen, methocarbamol (ROBAXIN) IV, morphine injection, ondansetron **OR** ondansetron (ZOFRAN) IV, polyethylene glycol, traMADol, zolpidem  HPI: Karl Aguilar is a 49 y.o. male insulin-dependent diabetes mellitus smoking with apparent chronic lower back pain that is previously responded to physical therapy who had a skin infection in his left knee in the soft tissue that was treated by drainage and oral antibiotics by his primary care physician in December.  In January he began having low back pain and was worked up by Dr. Alfonso Ramus.  He apparently responded to several courses of systemic corticosteroids continue to have relapse of his pain.  He ended up having an MRI under sedation due to his claustrophobia and this MRI it showed evidence of lumbar discitis.  He was brought in through the ER where I asked them to hold antibiotics.  IR guided biopsy obtained material that  is now growing Staph aureus.  We will plan on giving him at least 6 weeks of systemic antibiotics for Staph aureus.  Vancomycin has been started and we will use cefazolin as well in the interim until we have sensitivity data back.  I will place orders for PICC line as well.   Review of Systems: Review of Systems  Constitutional: Negative for chills, fever, malaise/fatigue and weight loss.  HENT: Negative for congestion and sore throat.   Eyes: Negative for blurred vision and photophobia.  Respiratory: Negative for cough, shortness of breath and wheezing.   Cardiovascular: Negative for chest pain, palpitations and leg swelling.  Gastrointestinal: Negative for abdominal pain, blood in stool, constipation, diarrhea, heartburn, melena, nausea and vomiting.  Genitourinary: Negative for dysuria, flank pain and hematuria.  Musculoskeletal: Positive for back pain. Negative for falls, joint pain and myalgias.  Skin: Negative for itching and rash.  Neurological: Negative for dizziness, focal weakness, loss of consciousness, weakness and headaches.  Endo/Heme/Allergies: Does not bruise/bleed easily.  Psychiatric/Behavioral: Negative for depression and suicidal ideas. The patient does not have insomnia.     Past Medical History:  Diagnosis Date  . History of kidney stones     Social History   Tobacco Use  . Smoking status: Current Every Day Smoker    Packs/day: 1.00    Years: 32.00    Pack years: 32.00    Types: Cigarettes  . Smokeless tobacco: Never Used  Substance Use Topics  . Alcohol  use: Yes    Comment: occasional  . Drug use: No    History reviewed. No pertinent family history. Allergies  Allergen Reactions  . Asa [Aspirin] Nausea And Vomiting    OBJECTIVE: Blood pressure 139/71, pulse (!) 102, temperature 98.2 F (36.8 C), temperature source Oral, resp. rate 17, height 5\' 11"  (1.803 m), weight 85.3 kg, SpO2 98 %.  Physical Exam Constitutional:      General: He is not  in acute distress.    Appearance: Normal appearance. He is well-developed. He is not ill-appearing or diaphoretic.  HENT:     Head: Normocephalic and atraumatic.     Right Ear: Hearing and external ear normal.     Left Ear: Hearing and external ear normal.     Nose: No nasal deformity or rhinorrhea.  Eyes:     General: No scleral icterus.    Conjunctiva/sclera: Conjunctivae normal.     Right eye: Right conjunctiva is not injected.     Left eye: Left conjunctiva is not injected.     Pupils: Pupils are equal, round, and reactive to light.  Neck:     Vascular: No JVD.  Cardiovascular:     Rate and Rhythm: Normal rate and regular rhythm.     Heart sounds: S1 normal and S2 normal.  Pulmonary:     Effort: Pulmonary effort is normal. No respiratory distress.     Breath sounds: No wheezing.  Abdominal:     General: There is no distension.     Palpations: Abdomen is soft.  Musculoskeletal:        General: Normal range of motion.     Right shoulder: Normal.     Left shoulder: Normal.     Cervical back: Normal range of motion and neck supple.     Right hip: Normal.     Left hip: Normal.     Right knee: Normal.     Left knee: Normal.  Lymphadenopathy:     Head:     Right side of head: No submandibular, preauricular or posterior auricular adenopathy.     Left side of head: No submandibular, preauricular or posterior auricular adenopathy.     Cervical: No cervical adenopathy.     Right cervical: No superficial or deep cervical adenopathy.    Left cervical: No superficial or deep cervical adenopathy.  Skin:    General: Skin is warm and dry.     Coloration: Skin is not pale.     Findings: No abrasion, bruising, ecchymosis, erythema, lesion or rash.     Nails: There is no clubbing.  Neurological:     Mental Status: He is alert and oriented to person, place, and time.     Sensory: No sensory deficit.     Coordination: Coordination normal.     Gait: Gait normal.  Psychiatric:         Attention and Perception: He is attentive.        Mood and Affect: Mood normal.        Speech: Speech normal.        Behavior: Behavior normal. Behavior is cooperative.        Thought Content: Thought content normal.        Judgment: Judgment normal.     Lab Results Lab Results  Component Value Date   WBC 8.3 12/28/2019   HGB 10.6 (L) 12/28/2019   HCT 33.3 (L) 12/28/2019   MCV 86.9 12/28/2019   PLT 284 12/28/2019    Lab Results  Component Value Date   CREATININE 1.00 12/28/2019   BUN 12 12/28/2019   NA 133 (L) 12/28/2019   K 4.0 12/28/2019   CL 99 12/28/2019   CO2 23 12/28/2019    Lab Results  Component Value Date   ALT 35 12/26/2019   AST 16 12/26/2019   ALKPHOS 115 12/26/2019   BILITOT 0.7 12/26/2019     Microbiology: Recent Results (from the past 240 hour(s))  SARS CORONAVIRUS 2 (TAT 6-24 HRS) Nasopharyngeal Nasopharyngeal Swab     Status: None   Collection Time: 12/21/19  1:44 PM   Specimen: Nasopharyngeal Swab  Result Value Ref Range Status   SARS Coronavirus 2 NEGATIVE NEGATIVE Final    Comment: (NOTE) SARS-CoV-2 target nucleic acids are NOT DETECTED. The SARS-CoV-2 RNA is generally detectable in upper and lower respiratory specimens during the acute phase of infection. Negative results do not preclude SARS-CoV-2 infection, do not rule out co-infections with other pathogens, and should not be used as the sole basis for treatment or other patient management decisions. Negative results must be combined with clinical observations, patient history, and epidemiological information. The expected result is Negative. Fact Sheet for Patients: HairSlick.no Fact Sheet for Healthcare Providers: quierodirigir.com This test is not yet approved or cleared by the [place] FDA and  has been authorized for detection and/or diagnosis of SARS-CoV-2 by FDA under an Emergency Use Authorization (EUA). This EUA will  remain  in effect (meaning this test can be used) for the duration of the COVID-19 declaration under Section 56 4(b)(1) of the Act, 21 U.S.C. section 360bbb-3(b)(1), unless the authorization is terminated or revoked sooner. Performed at [facility], [address] [place], [place] [address]   Culture, blood (Routine x 2)     Status: None (Preliminary result)   Collection Time: 12/26/19 11:55 AM   Specimen: BLOOD  Result Value Ref Range Status   Specimen Description BLOOD RIGHT ANTECUBITAL  Final   Special Requests   Final    BOTTLES DRAWN AEROBIC AND ANAEROBIC Blood Culture adequate volume   Culture   Final    NO GROWTH 2 DAYS Performed at [facility], [address], [place], [place] [address]    Report Status PENDING  Incomplete  Respiratory Panel by RT PCR (Flu A&B, Covid) - Nasopharyngeal Swab     Status: None   Collection Time: 12/26/19  1:46 PM   Specimen: Nasopharyngeal Swab  Result Value Ref Range Status   SARS Coronavirus 2 by RT PCR NEGATIVE NEGATIVE Final    Comment: (NOTE) SARS-CoV-2 target nucleic acids are NOT DETECTED. The SARS-CoV-2 RNA is generally detectable in upper respiratoy specimens during the acute phase of infection. The lowest concentration of SARS-CoV-2 viral copies this assay can detect is 131 copies/mL. A negative result does not preclude SARS-Cov-2 infection and should not be used as the sole basis for treatment or other patient management decisions. A negative result may occur with  improper specimen collection/handling, submission of specimen other than nasopharyngeal swab, presence of viral mutation(s) within the areas targeted by this assay, and inadequate number of viral copies (<131 copies/mL). A negative result must be combined with clinical observations, patient history, and epidemiological information. The expected result is Negative. Fact Sheet for Patients:  https://www.moore.com/ Fact Sheet for  Healthcare Providers:  https://www.young.biz/ This test is not yet ap proved or cleared by the [place] FDA and  has been authorized for detection and/or diagnosis of SARS-CoV-2 by FDA under an Emergency  Use Authorization (EUA). This EUA will remain  in effect (meaning this test can be used) for the duration of the COVID-19 declaration under Section 564(b)(1) of the Act, 21 U.S.C. section 360bbb-3(b)(1), unless the authorization is terminated or revoked sooner.    Influenza A by PCR NEGATIVE NEGATIVE Final   Influenza B by PCR NEGATIVE NEGATIVE Final    Comment: (NOTE) The Xpert Xpress SARS-CoV-2/FLU/RSV assay is intended as an aid in  the diagnosis of influenza from Nasopharyngeal swab specimens and  should not be used as a sole basis for treatment. Nasal washings and  aspirates are unacceptable for Xpert Xpress SARS-CoV-2/FLU/RSV  testing. Fact Sheet for Patients: https://www.moore.com/ Fact Sheet for Healthcare Providers: https://www.young.biz/ This test is not yet approved or cleared by the [place] FDA and  has been authorized for detection and/or diagnosis of SARS-CoV-2 by  FDA under an Emergency Use Authorization (EUA). This EUA will remain  in effect (meaning this test can be used) for the duration of the  Covid-19 declaration under Section 564(b)(1) of the Act, 21  U.S.C. section 360bbb-3(b)(1), unless the authorization is  terminated or revoked. Performed at [facility], [address] [place], [place] [address]   Culture, blood (Routine x 2)     Status: None (Preliminary result)   Collection Time: 12/26/19  4:06 PM   Specimen: BLOOD  Result Value Ref Range Status   Specimen Description BLOOD RIGHT ANTECUBITAL  Final   Special Requests   Final    BOTTLES DRAWN AEROBIC AND ANAEROBIC Blood Culture adequate volume   Culture   Final    NO GROWTH 2 DAYS Performed at [facility], [address] [place], [place] [address]    Report Status PENDING  Incomplete  MRSA PCR Screening     Status: None   Collection Time: 12/27/19  6:08 AM   Specimen: Nasopharyngeal  Result Value Ref Range Status   MRSA by PCR NEGATIVE NEGATIVE Final    Comment:        The GeneXpert MRSA Assay (FDA approved for NASAL specimens only), is one component of a comprehensive MRSA colonization surveillance program. It is not intended to diagnose MRSA infection nor to guide or monitor treatment for MRSA infections. Performed at [facility], [address], [place], [place] [address]   Aerobic/Anaerobic Culture (surgical/deep wound)     Status: None (Preliminary result)   Collection Time: 12/27/19  3:19 PM   Specimen: PATH Disc; Body Fluid  Result Value Ref Range Status   Specimen Description WOUND  Final   Special Requests DISC ASPIRATE L4 L5  Final   Gram Stain   Final    NO WBC SEEN FEW GRAM POSITIVE COCCI Performed at [facility], [address], [place], [place] [address]    Culture FEW STAPHYLOCOCCUS AUREUS  Final   Report Status PENDING  Incomplete    Acey Lav, MD [facility] [phone] pager  12/28/2019, 12:13 PM

## 2019-12-29 ENCOUNTER — Encounter (HOSPITAL_COMMUNITY): Payer: Self-pay | Admitting: Internal Medicine

## 2019-12-29 ENCOUNTER — Inpatient Hospital Stay: Payer: Self-pay

## 2019-12-29 DIAGNOSIS — M4626 Osteomyelitis of vertebra, lumbar region: Secondary | ICD-10-CM | POA: Diagnosis not present

## 2019-12-29 LAB — CBC
HCT: 35.7 % — ABNORMAL LOW (ref 39.0–52.0)
Hemoglobin: 11.2 g/dL — ABNORMAL LOW (ref 13.0–17.0)
MCH: 28 pg (ref 26.0–34.0)
MCHC: 31.4 g/dL (ref 30.0–36.0)
MCV: 89.3 fL (ref 80.0–100.0)
Platelets: 346 10*3/uL (ref 150–400)
RBC: 4 MIL/uL — ABNORMAL LOW (ref 4.22–5.81)
RDW: 14.4 % (ref 11.5–15.5)
WBC: 8.3 10*3/uL (ref 4.0–10.5)
nRBC: 0 % (ref 0.0–0.2)

## 2019-12-29 LAB — GLUCOSE, CAPILLARY
Glucose-Capillary: 105 mg/dL — ABNORMAL HIGH (ref 70–99)
Glucose-Capillary: 92 mg/dL (ref 70–99)
Glucose-Capillary: 113 mg/dL — ABNORMAL HIGH (ref 70–99)

## 2019-12-29 LAB — BASIC METABOLIC PANEL
Anion gap: 11 (ref 5–15)
BUN: 9 mg/dL (ref 6–20)
CO2: 23 mmol/L (ref 22–32)
Calcium: 9.5 mg/dL (ref 8.9–10.3)
Chloride: 99 mmol/L (ref 98–111)
Creatinine, Ser: 1.09 mg/dL (ref 0.61–1.24)
GFR calc Af Amer: >60 mL/min (ref >60)
GFR calc non Af Amer: >60 mL/min (ref >60)
Glucose, Bld: 92 mg/dL (ref 70–99)
Potassium: 4 mmol/L (ref 3.5–5.1)
Sodium: 133 mmol/L — ABNORMAL LOW (ref 135–145)

## 2019-12-29 MED ORDER — NICOTINE 14 MG/24HR TD PT24: 14.0000 mg | MEDICATED_PATCH | Freq: Every day | TRANSDERMAL | 0 refills | Status: AC

## 2019-12-29 MED ORDER — POLYETHYLENE GLYCOL 3350 17 G PO PACK: 17.0000 g | PACK | Freq: Every day | ORAL | 0 refills | Status: AC | PRN

## 2019-12-29 MED ORDER — METFORMIN HCL 500 MG PO TABS: 500.0000 mg | ORAL_TABLET | Freq: Two times a day (BID) | ORAL | 0 refills | Status: AC

## 2019-12-29 MED ORDER — IBUPROFEN 200 MG PO TABS: 800.0000 mg | ORAL_TABLET | Freq: Three times a day (TID) | ORAL | 0 refills | Status: AC | PRN

## 2019-12-29 MED ORDER — ADULT MULTIVITAMIN W/MINERALS CH: 1.0000 | ORAL_TABLET | Freq: Every day | ORAL | 0 refills | Status: AC

## 2019-12-29 MED ORDER — LIVING WELL WITH DIABETES BOOK: 1.0000 | Freq: Once | 0 refills | Status: AC

## 2019-12-29 MED ORDER — TRAMADOL HCL 50 MG PO TABS: 50.0000 mg | ORAL_TABLET | Freq: Two times a day (BID) | ORAL | 0 refills | Status: AC | PRN

## 2019-12-29 MED ORDER — SODIUM CHLORIDE 0.9 % IV SOLN: INTRAVENOUS | Status: AC

## 2019-12-29 MED ORDER — CEFAZOLIN IV (FOR PTA / DISCHARGE USE ONLY)
2.0000 g | Freq: Three times a day (TID) | INTRAVENOUS | 0 refills | Status: AC
Start: 2019-12-29 — End: 2020-02-16

## 2019-12-29 MED ORDER — HEPARIN SOD (PORK) LOCK FLUSH 100 UNIT/ML IV SOLN
250.0000 [IU] | INTRAVENOUS | Status: AC | PRN
  Administered 2019-12-29: 250 [IU]
  Filled 2019-12-29: qty 2.5

## 2019-12-29 MED ORDER — METHOCARBAMOL 500 MG PO TABS: 500.0000 mg | ORAL_TABLET | Freq: Three times a day (TID) | ORAL | 0 refills | Status: AC | PRN

## 2019-12-29 NOTE — Progress Notes (Signed)
      INFECTIOUS DISEASE ATTENDING ADDENDUM:   Date: 12/29/2019  Patient name: Karl Aguilar  Medical record number: [id number]  Date of birth: 03-Oct-1970   Patient growing MSSA  HE is narrowed to ancef  Diagnosis:  Diskitis  Culture Result: MSSA  Allergies  Allergen Reactions  . Asa [Aspirin] Nausea And Vomiting    OPAT Orders Discharge antibiotics:  Cefazolin   Duration:  6 weeks  End Date:  02/16/2020  [facility] Per Protocol:   Labs  weekly while on IV antibiotics: _x_ CBC with differential _x_ BMP w GFR/CMP _x_ CRP _x_ ESR   x__ Please pull PIC at completion of IV antibiotics __ Please leave PIC in place until doctor has seen patient or been notified  Fax weekly labs to [phone]  Clinic Follow Up Appt:   Karl Aguilar has an appointment on 01/20/2020 with Dr. Tommy Medal at [facility] for Infectious Disease is located in the [facility] in [place].  [address], which is located to the left of the elevators.  Phone: [phone]  Fax: [phone]  [email or web address]  He should arrive 15 minutes prior to his appt.   [name] 12/29/2019, 3:12 PM

## 2019-12-29 NOTE — TOC Transition Note (Addendum)
Transition of Care [facility]) - CM/SW Discharge Note   Patient Details  Name: Karl Aguilar MRN: [id number] Date of Birth: 1970/09/19  Transition of Care [facility]) CM/SW Contact:  Deveron Furlong, RN 12/29/2019, 2:21 PM   Clinical Narrative:    Patient to dc home with 6 weeks of IV Ancef q8h and HH RN, PT, OT.  Pam, RN with [facility] accepts referral for [facility] to provide antibiotic and infusion supplies.  Barbara Cower with [facility] accepts referral for PT/OT, but unable to staff RN for infusion.  Pam will arrange RN with [facility].  Pam also to come to hospital around 3pm to teach patient how to administer Iv antibiotic.  Patient will start home doses tonight at 10pm dose.  [facility] will visit Tuesday. Patient is agreeable to this plan.    No DME needs.  Addendum- After PT eval, patient refuses [facility] PT/OT stating he doesn't need therapy.   Pam with [facility] infusion educated patient on home IV infusion.  [facility] RN will visit tomorrow.     Final next level of care: Home w Home Health Services Barriers to Discharge: No Barriers Identified    Discharge Plan and Services      HH Arranged: RN, PT, OT [facility] Agency: [facility] ([facility]), [facility]) Date HH Agency Contacted: 12/29/19 Time HH Agency Contacted: 1200 Representative spoke with at [facility] Agency: Jeri Modena, Feliberto Gottron

## 2019-12-29 NOTE — Progress Notes (Signed)
PHARMACY CONSULT NOTE FOR:  OUTPATIENT  PARENTERAL ANTIBIOTIC THERAPY (OPAT)  Indication: Discitis Regimen: Cefazolin 2g IV every 8 hours End date: 02/16/2020  IV antibiotic discharge orders are pended. To discharging provider: please sign these orders via discharge navigator,  Select New Orders & click on the button choice - Manage This Unsigned Work.     Harlow Mares, PharmD PGY2 Pharmacy Resident Phone [phone]  12/29/2019   12:46 PM

## 2019-12-29 NOTE — Evaluation (Signed)
Physical Therapy Evaluation Patient Details Name: Karl Aguilar MRN: [id number] DOB: 1971/06/28 Today's Date: 12/29/2019   History of Present Illness  49 y.o. male with PMH of chronic back pain, +smoker, and insulin-dependent diabetes mellitus. He had a skin infection in his left knee in the soft tissue that was treated by drainage and oral antibiotics by his primary care physician in December. In January he began having back pain which has worsened in severity. MRI revealed acute osteomyelitis of the lumbar spine.    Clinical Impression  PT eval complete. Pt is independent/modified independent with all functional mobility. Educated pt on back precautions. No DME or follow up services indicated at this time. Pt scheduled to receive 6 weeks antibiotics. If back pain still present after the completion of antibiotics, recommended OPPT at that time.     Follow Up Recommendations No PT follow up    Equipment Recommendations  None recommended by PT    Recommendations for Other Services       Precautions / Restrictions Precautions Precautions: Back Precaution Comments: Pt educated on back precautions for comfort.      Mobility  Bed Mobility               General bed mobility comments: Unable to tolerate transition to supine due to pain. Pt sleeping in recliner at home.  Transfers Overall transfer level: Independent Equipment used: Ambulation equipment used                Ambulation/Gait Ambulation/Gait assistance: Modified independent (Device/Increase time) Gait Distance (Feet): 300 Feet Assistive device: (one crutch on right) Gait Pattern/deviations: Step-through pattern;Decreased stride length Gait velocity: WFL Gait velocity interpretation: >2.62 ft/sec, indicative of community ambulatory General Gait Details: steady Insurance risk surveyor    Modified Rankin (Stroke Patients Only)       Balance Overall balance assessment: No  apparent balance deficits (not formally assessed)                                           Pertinent Vitals/Pain Pain Assessment: 0-10 Pain Score: 1  Pain Location: low back Pain Descriptors / Indicators: Discomfort Pain Intervention(s): Monitored during session;Repositioned    Home Living Family/patient expects to be discharged to:: Private residence Living Arrangements: Spouse/significant other;Children Available Help at Discharge: Family;Available 24 hours/day Type of Home: House Home Access: Ramped entrance     Home Layout: One level Home Equipment: Crutches      Prior Function Level of Independence: Independent         Comments: Pt currently using one crutch on right due to L knee pain.     Hand Dominance        Extremity/Trunk Assessment   Upper Extremity Assessment Upper Extremity Assessment: Overall WFL for tasks assessed    Lower Extremity Assessment Lower Extremity Assessment: Overall WFL for tasks assessed    Cervical / Trunk Assessment Cervical / Trunk Assessment: Normal  Communication   Communication: No difficulties  Cognition Arousal/Alertness: Awake/alert Behavior During Therapy: WFL for tasks assessed/performed Overall Cognitive Status: Within Functional Limits for tasks assessed                                        General Comments  Exercises     Assessment/Plan    PT Assessment Patent does not need any further PT services  PT Problem List         PT Treatment Interventions      PT Goals (Current goals can be found in the Care Plan section)  Acute Rehab PT Goals Patient Stated Goal: home today PT Goal Formulation: All assessment and education complete, DC therapy    Frequency     Barriers to discharge        Co-evaluation               AM-PAC PT "6 Clicks" Mobility  Outcome Measure Help needed turning from your back to your side while in a flat bed without using  bedrails?: None Help needed moving from lying on your back to sitting on the side of a flat bed without using bedrails?: None Help needed moving to and from a bed to a chair (including a wheelchair)?: None Help needed standing up from a chair using your arms (e.g., wheelchair or bedside chair)?: None Help needed to walk in hospital room?: None Help needed climbing 3-5 steps with a railing? : None 6 Click Score: 24    End of Session   Activity Tolerance: Patient tolerated treatment well Patient left: in chair;with family/visitor present;with call bell/phone within reach Nurse Communication: Mobility status PT Visit Diagnosis: Pain;Other abnormalities of gait and mobility (R26.89)    Time: 8828-0034 PT Time Calculation (min) (ACUTE ONLY): 15 min   Charges:   PT Evaluation $PT Eval Low Complexity: 1 Low          Lorrin Goodell, PT  Office # [phone] Pager [phone]   Lorriane Shire 12/29/2019, 2:01 PM

## 2019-12-29 NOTE — Discharge Summary (Addendum)
Discharge Summary  Karl Aguilar:[id number] DOB: 04-01-1971  PCP: Redmond School, MD  Admit date: 12/26/2019 Discharge date: 12/29/2019  Time spent: 35 minutes  Recommendations for Outpatient Follow-up:  1. Follow-up with infectious disease in the next 4 weeks 2. Follow-up with orthopedic surgery Dr. Alfonso Ramus in the next 1 to 2 weeks 3. Follow-up with your primary care provider in the next 1 to 2 weeks 4. Take your medications as prescribed 5. Continue PT OT with assistance and fall precautions  Discharge Diagnoses:  Active Hospital Problems   Diagnosis Date Noted  . Acute osteomyelitis of lumbar spine ([place]) 12/26/2019  . Tobacco dependence 12/26/2019    Resolved Hospital Problems  No resolved problems to display.    Discharge Condition: Stable  Diet recommendation: Carb modified diet  Vitals:   12/29/19 0000 12/29/19 0456  BP: 120/76 111/76  Pulse: 90 94  Resp: 20 20  Temp: 98.2 F (36.8 C)   SpO2: 98% 97%    History of present illness:  Karl Aguilar a 49 y.o.malewith pastmedical history significant for chronic lower back pain for which he sees orthopedic surgery Dr. Alfonso Ramus who presented to [facility] ED from home as recommended by his surgeon for evaluation and management of his worsening back pain.  After further work-up he was found to have discitis at the L4-L5 level seen on lumbar spine MRI done on 12/24/19.  Infectious disease was consulted with recommendation to hold off antibiotics until an aspirate of the L4-L5 disc is obtained for analysis.  This was done by interventional radiology Dr. Estanislado Pandy on 12/27/19.  Aspirate showed staph Aureus with resistance to tetracycline.  Patient was started on IV antibiotics IV vancomycin and Ancef on 12/28/19.  IV vancomycin was discontinued on 12/29/2019 after results of aspirate culture and specificities.  Pharmacy consulted for OPAT orders.  Discussed with infectious disease, patient will follow up with Dr. Tommy Medal in the next 4  weeks.  A PICC line has been placed on 12/28/2019.  Transition of care team will assist with home health services for home infusion of prolonged IV antibiotics.  Patient will needs total of 6 weeks of IV Ancef.  Significant events: S/p L4-L5 fluoroscopy guided disc aspiration by IR, sent for analysis on 12/27/2019.  Hospital course complicated by hyperglycemia and elevated A1c 7.1 with no prior history of diabetes.  Not previously on oral hypoglycemics.  Seen by diabetes coordinator.  Received insulin sliding scale.  Will discharge home on Metformin 500 mg twice daily.  Will need to follow up with his primary care provider for possible type 2 diabetes.  12/29/19: Seen and examined.  No acute events overnight.  Patient would like to go home.  He has no new complaints.  Vital signs and labs reviewed and are stable.  Okay to discharge home from ID standpoint.    Hospital Course:  Principal Problem:   Acute osteomyelitis of lumbar spine (HCC) Active Problems:   Tobacco dependence  L4-L5 discitis with unclear source No evidence of systemic infection, no leukocytosis and afebrile. Sed rate 80, CRP 7.1 Status post L4-L5 fluoroscopy guided cyst aspiration, body fluid growing few staph aureus with resistance to tetracycline. MRSA screening test negative Blood cultures negative to date Received 1 day of IV vancomycin. Currently on IV Ancef, continue x6 weeks. Follow-up with infectious disease in the next 4 weeks. Follow-up with orthopedic surgery in the next 1 to 2 weeks Pain control with tramadol for moderate to severe pain as needed and Robaxin for  spasms as needed Bowel regimen to avoid opiate-induced constipation  Possible type 2 diabetes With no prior history of diabetes Not on oral hypoglycemics prior to admission Hemoglobin A1c 7.1, complete work-up outpatient with PCP. Start Metformin 500 mg twice daily LDL 119 Seen by diabetes coordinator  Resolved transient elevated BP likely  contributed by pain Blood pressure now at goal No prior history of hypertension Follow-up with your PCP  Transient sinus tachycardia Now resolved, TSH is normal  Ambulatory dysfunction in the setting of chronic back pain PT OT to assess for recommendations Continue PT OT with assistance and fall precautions  Asymptomatic pyuria UA positive for pyuria  Tobacco use disorder Tobacco cessation counseling Nicotine patch as needed    Code Status:Full  Consults called:ID; orthopedics; IR; nutrition    Discharge Exam: BP 111/76 (BP Location: Right Arm)   Pulse 94   Temp 98.2 F (36.8 C) (Oral)   Resp 20   Ht 5' 11"  (1.803 m)   Wt 85.3 kg   SpO2 97%   BMI 26.22 kg/m  . General: 49 y.o. year-old male well developed well nourished in no acute distress.  Alert and oriented x3. . Cardiovascular: Regular rate and rhythm with no rubs or gallops.  No thyromegaly or JVD noted.   Marland Kitchen Respiratory: Clear to auscultation with no wheezes or rales. Good inspiratory effort. . Abdomen: Soft nontender nondistended with normal bowel sounds x4 quadrants. . Musculoskeletal: No lower extremity edema. 2/4 pulses in all 4 extremities. Marland Kitchen Psychiatry: Mood is appropriate for condition and setting  Discharge Instructions You were cared for by a hospitalist during your hospital stay. If you have any questions about your discharge medications or the care you received while you were in the hospital after you are discharged, you can call the unit and asked to speak with the hospitalist on call if the hospitalist that took care of you is not available. Once you are discharged, your primary care physician will handle any further medical issues. Please note that NO REFILLS for any discharge medications will be authorized once you are discharged, as it is imperative that you return to your primary care physician (or establish a relationship with a primary care physician if you do not have one) for your  aftercare needs so that they can reassess your need for medications and monitor your lab values.  Discharge Instructions    [facility] pharmacist to adjust dose for Vancomycin, Aminoglycosides and other anti-infective therapies as requested by physician.   Complete by: As directed    [facility] to provide Cath Flo 15m   Complete by: As directed    Administer for PICC line occlusion and as ordered by physician for other access device issues.   Anaphylaxis Kit: Provided to treat any anaphylactic reaction to the medication being provided to the patient if First Dose or when requested by physician   Complete by: As directed    Epinephrine 155mml vial / amp: Administer 0.40m66m0.40ml41mubcutaneously once for moderate to severe anaphylaxis, nurse to call physician and pharmacy when reaction occurs and call 911 if needed for immediate care   Diphenhydramine 50mg22mIV vial: Administer 25-50mg 740mM PRN for first dose reaction, rash, itching, mild reaction, nurse to call physician and pharmacy when reaction occurs   Sodium Chloride 0.9% NS 500ml I2mdminister if needed for hypovolemic blood pressure drop or as ordered by physician after call to physician with anaphylactic reaction   Change dressing on IV access line weekly  and PRN   Complete by: As directed    Flush IV access with Sodium Chloride 0.9% and Heparin 10 units/ml or 100 units/ml   Complete by: As directed    Home infusion instructions - [facility]   Complete by: As directed    Instructions: Flush IV access with Sodium Chloride 0.9% and Heparin 10units/ml or 100units/ml   Change dressing on IV access line: Weekly and PRN   Instructions Cath Flo 55m: Administer for PICC Line occlusion and as ordered by physician for other access device   [facility] pharmacist to adjust dose for: Vancomycin, Aminoglycosides and other anti-infective therapies as requested by physician   Method of administration  may be changed at the discretion of home infusion pharmacist based upon assessment of the patient and/or caregiver's ability to self-administer the medication ordered   Complete by: As directed      Allergies as of 12/29/2019      Reactions   Asa [aspirin] Nausea And Vomiting      Medication List    TAKE these medications   ceFAZolin  IVPB Commonly known as: ANCEF Inject 2 g into the vein every 8 (eight) hours. Indication:  discitis First Dose: Yes Last Day of Therapy:  02/16/2020 Labs - Once weekly:  CBC/D and BMP, Labs - Every other week:  ESR and CRP Method of administration: IV Push Method of administration may be changed at the discretion of home infusion pharmacist based upon assessment of the patient and/or caregiver's ability to self-administer the medication ordered.   ibuprofen 200 MG tablet Commonly known as: ADVIL Take 4 tablets (800 mg total) by mouth every 8 (eight) hours as needed for up to 3 days (for pain). What changed: when to take this   living well with diabetes book Misc 1 each by Does not apply route once for 1 dose.   metFORMIN 500 MG tablet Commonly known as: Glucophage Take 1 tablet (500 mg total) by mouth 2 (two) times daily with a meal.   methocarbamol 500 MG tablet Commonly known as: ROBAXIN Take 1 tablet (500 mg total) by mouth every 8 (eight) hours as needed for up to 5 days for muscle spasms.   multivitamin with minerals Tabs tablet Take 1 tablet by mouth daily. Start taking on: Dec 30, 2019   nicotine 14 mg/24hr patch Commonly known as: NICODERM CQ - dosed in mg/24 hours Place 1 patch (14 mg total) onto the skin daily. Start taking on: Dec 30, 2019   polyethylene glycol 17 g packet Commonly known as: MIRALAX / GLYCOLAX Take 17 g by mouth daily as needed for mild constipation.   traMADol 50 MG tablet Commonly known as: ULTRAM Take 1 tablet (50 mg total) by mouth every 12 (twelve) hours as needed for up to 5 days for moderate pain. What  changed: when to take this            Discharge Care Instructions  (From admission, onward)         Start     Ordered   12/29/19 0000  Change dressing on IV access line weekly and PRN  (Home infusion instructions - [facility] )     12/29/19 1327         Allergies  Allergen Reactions  . Asa [Aspirin] Nausea And Vomiting   Follow-up Information    FRedmond School MD. Call in 1 day(s).   Specialty: Internal Medicine Why: Please call for a post hospital follow-up appointment.  Contact information: [address] [place] [address] [phone]        Tommy Medal, Lavell Islam, MD. Call in 1 day(s).   Specialty: Infectious Diseases Why: Please call for a post hospital follow-up appointment. Contact information: [address] [address] [phone]        Berle Mull, MD. Call in 1 day(s).   Specialty: Sports Medicine Why: Please call for a post hospital follow-up appointment. Contact information: [address] [place] [address] [phone]            The results of significant diagnostics from this hospitalization (including imaging, microbiology, ancillary and laboratory) are listed below for reference.    Significant Diagnostic Studies: DG Chest 2 View  Result Date: 12/26/2019 CLINICAL DATA:  Lower back pain, possible sepsis. EXAM: CHEST - 2 VIEW COMPARISON:  July 28, 2013. FINDINGS: The heart size and mediastinal contours are within normal limits. Both lungs are clear. No pneumothorax or pleural effusion is noted. The visualized skeletal structures are unremarkable. IMPRESSION: No active cardiopulmonary disease. Electronically Signed   By: Marijo Conception M.D.   On: 12/26/2019 12:27   MR LUMBAR SPINE WO CONTRAST  Result Date: 12/24/2019 CLINICAL DATA:  Severe back pain. EXAM: MRI LUMBAR SPINE WITHOUT CONTRAST TECHNIQUE: Multiplanar, multisequence MR imaging of the lumbar spine was performed.  No intravenous contrast was administered. COMPARISON:  None. FINDINGS: Segmentation:  Standard. Alignment:  Physiologic. Vertebrae: Prominent marrow edema involving the L4 and L5 vertebral bodies extending to the bilateral L5 pedicles with associated erosion of the opposing endplates and increased T2 signal of the intervening disc. Findings are consistent with discitis/osteomyelitis. No fracture or evidence of aggressive bone lesion. Conus medullaris and cauda equina: Conus extends to the T12-L1 level. Conus and cauda equina appear normal. Paraspinal and other soft tissues: Small right renal cyst. Edema of the periarticular soft tissues about the left L4-5 facet joint. Disc levels: T12-L1: No spinal canal or neural foraminal stenosis. L1-2: No spinal canal or neural foraminal stenosis. L2-3: No spinal canal or neural foraminal stenosis. L3-4: Shallow disc bulge, mild facet degenerative changes with bilateral joint effusion without significant spinal canal or neural foraminal stenosis. L4-5: Increased T2 signal of the disc which show mild bulging and associated left subarticular disc protrusion, bilateral facet effusion and mild ligamentum flavum redundancy. Findings result in mild spinal canal stenosis with effacement of the left subarticular zone which may compress on the traversing left L5 nerve root. There is moderate bilateral neural foraminal narrowing. L5-S1: Right central disc protrusion causing small indentation of the thecal sac and contacting the traversing right S1 nerve root. No spinal canal or neural foraminal stenosis. IMPRESSION: 1. Findings consistent with L4-L5 discitis/osteomyelitis. 2. L4-5 mild spinal canal stenosis, effacement of the left subarticular zone with possible impingement of the traversing left L5 nerve root. 3. L5-S1 right central disc protrusion contacting the traversing right S1 nerve root. Electronically Signed   By: Pedro Earls M.D.   On: 12/24/2019 11:36   [place]  EKG SITE RITE  Result Date: 12/29/2019 If Site Rite image not attached, placement could not be confirmed due to current cardiac rhythm.  [place] EKG SITE RITE  Result Date: 12/28/2019 If Site Rite image not attached, placement could not be confirmed due to current cardiac rhythm.   Microbiology: Recent Results (from the past 240 hour(s))  SARS CORONAVIRUS 2 (TAT 6-24 HRS) Nasopharyngeal Nasopharyngeal Swab     Status: None   Collection  Time: 12/21/19  1:44 PM   Specimen: Nasopharyngeal Swab  Result Value Ref Range Status   SARS Coronavirus 2 NEGATIVE NEGATIVE Final    Comment: (NOTE) SARS-CoV-2 target nucleic acids are NOT DETECTED. The SARS-CoV-2 RNA is generally detectable in upper and lower respiratory specimens during the acute phase of infection. Negative results do not preclude SARS-CoV-2 infection, do not rule out co-infections with other pathogens, and should not be used as the sole basis for treatment or other patient management decisions. Negative results must be combined with clinical observations, patient history, and epidemiological information. The expected result is Negative. Fact Sheet for Patients: SugarRoll.be Fact Sheet for Healthcare Providers: https://www.woods-mathews.com/ This test is not yet approved or cleared by the [place] FDA and  has been authorized for detection and/or diagnosis of SARS-CoV-2 by FDA under an Emergency Use Authorization (EUA). This EUA will remain  in effect (meaning this test can be used) for the duration of the COVID-19 declaration under Section 56 4(b)(1) of the Act, 21 U.S.C. section 360bbb-3(b)(1), unless the authorization is terminated or revoked sooner. Performed at [facility], [place] [address] [place], [place] [address]   Culture, blood (Routine x 2)     Status: None (Preliminary result)   Collection Time: 12/26/19 11:55 AM   Specimen: BLOOD  Result Value Ref Range Status    Specimen Description BLOOD RIGHT ANTECUBITAL  Final   Special Requests   Final    BOTTLES DRAWN AEROBIC AND ANAEROBIC Blood Culture adequate volume   Culture   Final    NO GROWTH 3 DAYS Performed at [facility], [place] [address], [place], [place] [address]    Report Status PENDING  Incomplete  Respiratory Panel by RT PCR (Flu A&B, Covid) - Nasopharyngeal Swab     Status: None   Collection Time: 12/26/19  1:46 PM   Specimen: Nasopharyngeal Swab  Result Value Ref Range Status   SARS Coronavirus 2 by RT PCR NEGATIVE NEGATIVE Final    Comment: (NOTE) SARS-CoV-2 target nucleic acids are NOT DETECTED. The SARS-CoV-2 RNA is generally detectable in upper respiratoy specimens during the acute phase of infection. The lowest concentration of SARS-CoV-2 viral copies this assay can detect is 131 copies/mL. A negative result does not preclude SARS-Cov-2 infection and should not be used as the sole basis for treatment or other patient management decisions. A negative result may occur with  improper specimen collection/handling, submission of specimen other than nasopharyngeal swab, presence of viral mutation(s) within the areas targeted by this assay, and inadequate number of viral copies (<131 copies/mL). A negative result must be combined with clinical observations, patient history, and epidemiological information. The expected result is Negative. Fact Sheet for Patients:  PinkCheek.be Fact Sheet for Healthcare Providers:  GravelBags.it This test is not yet ap proved or cleared by the [place] FDA and  has been authorized for detection and/or diagnosis of SARS-CoV-2 by FDA under an Emergency Use Authorization (EUA). This EUA will remain  in effect (meaning this test can be used) for the duration of the COVID-19 declaration under Section 564(b)(1) of the Act, 21 U.S.C. section 360bbb-3(b)(1), unless the authorization is  terminated or revoked sooner.    Influenza A by PCR NEGATIVE NEGATIVE Final   Influenza B by PCR NEGATIVE NEGATIVE Final    Comment: (NOTE) The Xpert Xpress SARS-CoV-2/FLU/RSV assay is intended as an aid in  the diagnosis of influenza from Nasopharyngeal swab specimens and  should not be used as a sole  basis for treatment. Nasal washings and  aspirates are unacceptable for Xpert Xpress SARS-CoV-2/FLU/RSV  testing. Fact Sheet for Patients: PinkCheek.be Fact Sheet for Healthcare Providers: GravelBags.it This test is not yet approved or cleared by the [place] FDA and  has been authorized for detection and/or diagnosis of SARS-CoV-2 by  FDA under an Emergency Use Authorization (EUA). This EUA will remain  in effect (meaning this test can be used) for the duration of the  Covid-19 declaration under Section 564(b)(1) of the Act, 21  U.S.C. section 360bbb-3(b)(1), unless the authorization is  terminated or revoked. Performed at [facility], [place] [address] [place], [place] [address]   Culture, blood (Routine x 2)     Status: None (Preliminary result)   Collection Time: 12/26/19  4:06 PM   Specimen: BLOOD  Result Value Ref Range Status   Specimen Description BLOOD RIGHT ANTECUBITAL  Final   Special Requests   Final    BOTTLES DRAWN AEROBIC AND ANAEROBIC Blood Culture adequate volume   Culture   Final    NO GROWTH 3 DAYS Performed at [facility], [place] [address], [place], [place] [address]    Report Status PENDING  Incomplete  MRSA PCR Screening     Status: None   Collection Time: 12/27/19  6:08 AM   Specimen: Nasopharyngeal  Result Value Ref Range Status   MRSA by PCR NEGATIVE NEGATIVE Final    Comment:        The GeneXpert MRSA Assay (FDA approved for NASAL specimens only), is one component of a comprehensive MRSA colonization surveillance program. It is not intended to diagnose MRSA infection nor to  guide or monitor treatment for MRSA infections. Performed at [facility], [place] [address], [place], [place] [address]   Aerobic/Anaerobic Culture (surgical/deep wound)     Status: None (Preliminary result)   Collection Time: 12/27/19  3:19 PM   Specimen: PATH Disc; Body Fluid  Result Value Ref Range Status   Specimen Description WOUND  Final   Special Requests DISC ASPIRATE L4 L5  Final   Gram Stain   Final    NO WBC SEEN FEW GRAM POSITIVE COCCI Performed at [facility], [address] [place], [place] [address]    Culture   Final    FEW STAPHYLOCOCCUS AUREUS NO ANAEROBES ISOLATED; CULTURE IN PROGRESS FOR 5 DAYS    Report Status PENDING  Incomplete   Organism ID, Bacteria STAPHYLOCOCCUS AUREUS  Final      Susceptibility   Staphylococcus aureus - MIC*    CIPROFLOXACIN <=0.5 SENSITIVE Sensitive     ERYTHROMYCIN <=0.25 SENSITIVE Sensitive     GENTAMICIN <=0.5 SENSITIVE Sensitive     OXACILLIN <=0.25 SENSITIVE Sensitive     TETRACYCLINE >=16 RESISTANT Resistant     VANCOMYCIN 1 SENSITIVE Sensitive     TRIMETH/SULFA <=10 SENSITIVE Sensitive     CLINDAMYCIN <=0.25 SENSITIVE Sensitive     RIFAMPIN <=0.5 SENSITIVE Sensitive     Inducible Clindamycin NEGATIVE Sensitive     * FEW STAPHYLOCOCCUS AUREUS     Labs: Basic Metabolic Panel: Recent Labs  Lab 12/24/19 0856 12/26/19 1154 12/27/19 0548 12/28/19 0648 12/29/19 1144  NA 136 137 136 133* 133*  K 3.9 3.7 3.7 4.0 4.0  CL 104 100 99 99 99  CO2 22 21* 26 23 23   GLUCOSE 117* 119* 119* 125* 92  BUN 10 10 11 12 9   CREATININE 1.08 1.11 1.10 1.00 1.09  CALCIUM 9.6  9.6 9.6 9.2 9.5   Liver Function Tests: Recent Labs  Lab 12/26/19 1154  AST 16  ALT 35  ALKPHOS 115  BILITOT 0.7  PROT 7.3  ALBUMIN 3.2*   No results for input(s): LIPASE, AMYLASE in the last 168 hours. No results for input(s): AMMONIA in the last 168 hours. CBC: Recent Labs  Lab 12/26/19 1154 12/27/19 1214 12/28/19 0648 12/29/19 1144    WBC 9.0 6.3 8.3 8.3  NEUTROABS 5.4  --  5.7  --   HGB 11.8* 11.4* 10.6* 11.2*  HCT 38.2* 35.3* 33.3* 35.7*  MCV 89.3 86.7 86.9 89.3  PLT 333 253 284 346   Cardiac Enzymes: No results for input(s): CKTOTAL, CKMB, CKMBINDEX, TROPONINI in the last 168 hours. BNP: BNP (last 3 results) No results for input(s): BNP in the last 8760 hours.  ProBNP (last 3 results) No results for input(s): PROBNP in the last 8760 hours.  CBG: Recent Labs  Lab 12/28/19 0739 12/28/19 1112 12/28/19 1642 12/29/19 0816 12/29/19 1149  GLUCAP 138* 93 159* 105* 92       Signed:  Kayleen Memos, MD [facility] 12/29/2019, 1:27 PM

## 2019-12-29 NOTE — Discharge Instructions (Signed)
Diskitis  Diskitis is the inflammation and infection of the disks in the spine. Disks are soft structures that cushion the bones of the spine (vertebrae). This is not a common condition. Diskitis most often affects the disks of the lower back (lumbar disks). It may also affect disks in the neck (cervical disks) or upper back (thoracic disks). What are the causes? This condition may be caused by:  An infection that spreads from an infection in another part of the body through the bloodstream. The most common sources are the skin, soft tissue, urinary tract, and respiratory tract.  An infection in another area of the spine. When diskitis develops, it is often accompanied by infection-induced inflammation of the bones (osteomyelitis) surrounding the spine.  An infection after a procedure or trauma to the spine. This may include surgery, lumbar puncture, a nerve block, and epidural analgesia. What increases the risk? People at higher risk of developing this condition include:  Children.  People with a weak disease-fighting system (immune system) or immune system disorders.  People with diabetes.  People with cancer.  People with HIV or AIDS.  People who use intravenous drugs.  People with liver or kidney disease. What are the signs or symptoms? Back pain or stomach pain is the most common symptom of diskitis. Walking, standing, and sitting may be painful. Children may refuse to crawl or walk. Other symptoms may include:  Back stiffness.  Trouble standing or rising from a sitting position.  Irritability.  Fever. How is this diagnosed? This condition may be diagnosed based on:  Your symptoms and medical history.  A physical exam.  Imaging tests, such as: ? X-ray of the spine. ? MRI of the spine. ? A bone scan.  Blood tests. How is this treated? This condition may be treated with:  Bed rest for a short time.  Medicines, such as: ? Antibiotics to treat a possible  bacterial infection. ? Anti-inflammatory medicines. ? Pain-relieving medicines.  A brace to keep your back from moving.  Surgery. Surgery is not normally needed but may be used if the infection is severe or if rest and medicines do not help. Follow these instructions at home: If you have a brace:  Wear the brace as told by your health care provider. Remove it only as told by your health care provider.  Keep the brace clean.  If the brace is not waterproof: ? Do not let it get wet. ? Cover it with a watertight covering when you take a bath or a shower. Medicines  Take over-the-counter and prescription medicines only as told by your health care provider.  If you were prescribed an antibiotic medicine, take it as told by your health care provider. Do not stop taking the antibiotic even if you start to feel better.  Do not drive or use heavy machinery while taking prescription pain medicine.  If you are taking prescription pain medicine, take actions to prevent or treat constipation. Your health care provider may recommend that you: ? Drink enough fluid to keep your urine pale yellow. ? Eat foods that are high in fiber, such as fresh fruits and vegetables, whole grains, and beans. ? Limit foods that are high in fat and processed sugars, such as fried or sweet foods. ? Take an over-the-counter or prescription medicine for constipation. General instructions  Rest as told by your health care provider. Return to your normal activities as told by your health care provider.  Do not use any products that contain nicotine  or tobacco, such as cigarettes and e-cigarettes. These can delay healing during treatment. If you need help quitting, ask your health care provider.  Keep all follow-up visits as told by your health care provider. This is important. Contact a health care provider if you:  Have difficulty walking or standing.  Have persistent back pain that is not relieved by  medicines.  Are having side effects from medicines.  Develop a fever. Get help right away if you:  Lose feeling in your legs or arms, or your legs or arms become weak.  Have numbness or tingling in your legs or arms.  Lose control of bowel or bladder function (incontinence). Summary  Disks are soft structures that cushion the bones of the spine.  Diskitis is a rare condition that causes inflammation and infection of the disks in the spine, especially the lower back (lumbar disks).  One cause of diskitis is an infection that spreads from an infection in another part of the body through the bloodstream.  Back pain or stomach pain is the most common symptom of diskitis. Walking, standing, and sitting may be painful.  Treatment may include bed rest, antibiotic medicine, anti-inflammatory medicine, pain-relieving medicine, a brace to keep your back from moving, or surgery. This information is not intended to replace advice given to you by your health care provider. Make sure you discuss any questions you have with your health care provider. Document Revised: 07/31/2017 Document Reviewed: 07/31/2017 Elsevier Patient Education  2020 [employer]  Type 2 Diabetes Mellitus, Diagnosis, Adult Type 2 diabetes (type 2 diabetes mellitus) is a long-term (chronic) disease. It may be caused by one or both of these problems:  Your pancreas does not make enough of a hormone called insulin.  Your body does not react in a normal way to insulin that it makes. Insulin lets sugars (glucose) go into cells in your body. This gives you energy. If you have type 2 diabetes, sugars cannot get into cells. This causes high blood sugar (hyperglycemia). Your doctor will set treatment goals for you. Generally, you should have these blood sugar levels:  Before meals (preprandial): 80-130 mg/dL (4.2-7.0 mmol/L).  After meals (postprandial): below 180 mg/dL (10 mmol/L).  A1c (hemoglobin A1c) level: less than  7%. Follow these instructions at home: Questions to ask your doctor  You may want to ask these questions: ? Do I need to meet with a diabetes educator? ? Where can I find a support group for people with diabetes? ? What equipment will I need to care for myself at home? ? What diabetes medicines do I need? When should I take them? ? How often do I need to check my blood sugar? ? What number can I call if I have questions? ? When is my next doctor's visit? General instructions  Take over-the-counter and prescription medicines only as told by your doctor.  Keep all follow-up visits as told by your doctor. This is important. Contact a doctor if:  Your blood sugar is at or above 240 mg/dL (62.3 mmol/L) for 2 days in a row.  You have been sick for 2 days or more, and you are not getting better.  You have had a fever for 2 days or more, and you are not getting better.  You have any of these problems for more than 6 hours: ? You cannot eat or drink. ? You feel sick to your stomach (nauseous). ? You throw up (vomit). ? You have watery poop (diarrhea). Get help right  away if:  Your blood sugar is lower than 54 mg/dL (3 mmol/L).  You get confused.  You have trouble: ? Thinking clearly. ? Breathing.  You have moderate or large ketone levels in your pee (urine). Summary  Type 2 diabetes is a long-term (chronic) disease. Your pancreas may not make enough of a hormone called insulin, or your body may not react normally to insulin that it makes.  Take over-the-counter and prescription medicines only as told by your doctor.  Keep all follow-up visits as told by your doctor. This is important. This information is not intended to replace advice given to you by your health care provider. Make sure you discuss any questions you have with your health care provider. Document Revised: 10/13/2017 Document Reviewed: 09/18/2015 Elsevier Patient Education  2020 [employer].

## 2019-12-30 DIAGNOSIS — M4626 Osteomyelitis of vertebra, lumbar region: Secondary | ICD-10-CM | POA: Diagnosis not present

## 2019-12-31 LAB — CULTURE, BLOOD (ROUTINE X 2)
Culture: NO GROWTH
Culture: NO GROWTH
Special Requests: ADEQUATE
Special Requests: ADEQUATE

## 2020-01-01 LAB — AEROBIC/ANAEROBIC CULTURE W GRAM STAIN (SURGICAL/DEEP WOUND): Gram Stain: NONE SEEN

## 2020-01-02 DIAGNOSIS — M4626 Osteomyelitis of vertebra, lumbar region: Secondary | ICD-10-CM | POA: Diagnosis not present

## 2020-01-07 ENCOUNTER — Encounter: Payer: Self-pay | Admitting: Infectious Disease

## 2020-01-07 DIAGNOSIS — M4626 Osteomyelitis of vertebra, lumbar region: Secondary | ICD-10-CM | POA: Diagnosis not present

## 2020-01-08 DIAGNOSIS — Z681 Body mass index (BMI) 19 or less, adult: Secondary | ICD-10-CM | POA: Diagnosis not present

## 2020-01-08 DIAGNOSIS — A4901 Methicillin susceptible Staphylococcus aureus infection, unspecified site: Secondary | ICD-10-CM | POA: Diagnosis not present

## 2020-01-08 DIAGNOSIS — M4630 Infection of intervertebral disc (pyogenic), site unspecified: Secondary | ICD-10-CM | POA: Diagnosis not present

## 2020-01-08 DIAGNOSIS — M4646 Discitis, unspecified, lumbar region: Secondary | ICD-10-CM | POA: Diagnosis not present

## 2020-01-10 DIAGNOSIS — M4626 Osteomyelitis of vertebra, lumbar region: Secondary | ICD-10-CM | POA: Diagnosis not present

## 2020-01-15 DIAGNOSIS — M4626 Osteomyelitis of vertebra, lumbar region: Secondary | ICD-10-CM | POA: Diagnosis not present

## 2020-01-17 DIAGNOSIS — M4626 Osteomyelitis of vertebra, lumbar region: Secondary | ICD-10-CM | POA: Diagnosis not present

## 2020-01-20 ENCOUNTER — Ambulatory Visit (INDEPENDENT_AMBULATORY_CARE_PROVIDER_SITE_OTHER): Payer: BC Managed Care – PPO | Admitting: Infectious Disease

## 2020-01-20 ENCOUNTER — Encounter: Payer: Self-pay | Admitting: Infectious Disease

## 2020-01-20 ENCOUNTER — Other Ambulatory Visit: Payer: Self-pay

## 2020-01-20 VITALS — BP 130/86 | HR 81 | Temp 97.9°F | Wt 193.0 lb

## 2020-01-20 DIAGNOSIS — A4901 Methicillin susceptible Staphylococcus aureus infection, unspecified site: Secondary | ICD-10-CM

## 2020-01-20 DIAGNOSIS — M4626 Osteomyelitis of vertebra, lumbar region: Secondary | ICD-10-CM

## 2020-01-20 DIAGNOSIS — F17200 Nicotine dependence, unspecified, uncomplicated: Secondary | ICD-10-CM

## 2020-01-20 HISTORY — DX: Methicillin susceptible Staphylococcus aureus infection, unspecified site: A49.01

## 2020-01-20 MED ORDER — CEPHALEXIN 500 MG PO CAPS: 500.0000 mg | ORAL_CAPSULE | Freq: Four times a day (QID) | ORAL | 1 refills | Status: AC

## 2020-01-20 NOTE — Progress Notes (Signed)
Subjective:  Chief complaint back pain  Patient ID: Karl Aguilar, male    DOB: 10-23-1970, 49 y.o.   MRN: [id number]  HPI Karl  Aguilar is a 49 y.o. male insulin-dependent diabetes mellitus smoking with apparent chronic lower back pain that is previously responded to physical therapy who had a skin infection in his left knee in the soft tissue that was treated by drainage and oral antibiotics by his primary care physician in December.  In January he began having low back pain and was worked up by Dr. Alfonso Ramus.  He apparently responded to several courses of systemic corticosteroids continue to have relapse of his pain.  He ended up having an MRI under sedation due to his claustrophobia and this MRI which I have personally reviewed and it did indeed  show evidence of lumbar discitis.  I   Antibiotics were withheld and he had an IR guided aspirate which yielded methicillin sensitive Staph aureus.  He was placed on cefazolin and is due to complete a 6-week course of therapy on June 20.  Since having had antibiotics given for several weeks his pain is improved dramatically.  He still does have pain with coughing or sudden movements and specific movements but not pain all the time as he was experiencing when he had his discitis and osteomyelitis not yet treated.  He does suffer from superimposed chronic back pain as well.   Past Medical History:  Diagnosis Date  . History of kidney stones   . MSSA (methicillin susceptible Staphylococcus aureus) infection 01/20/2020    Past Surgical History:  Procedure Laterality Date  . APPENDECTOMY    . CHOLECYSTECTOMY N/A 08/02/2013   Procedure: LAPAROSCOPIC CHOLECYSTECTOMY;  Surgeon: Jamesetta So, MD;  Location: [facility];  Service: General;  Laterality: N/A;  . IR LUMBAR Tea W/IMG GUIDE  12/27/2019  . RADIOLOGY WITH ANESTHESIA N/A 12/24/2019   Procedure: MRI LUMBER SPINE WITHOUT CONTRAST;  Surgeon: Radiologist, Medication, MD;  Location: [place];  Service: Radiology;  Laterality: N/A;    No family history on file.    Social History   Socioeconomic History  . Marital status: Married    Spouse name: Not on file  . Number of children: Not on file  . Years of education: Not on file  . Highest education level: Not on file  Occupational History  . Occupation: Armed forces technical officer  Tobacco Use  . Smoking status: Current Every Day Smoker    Packs/day: 1.00    Years: 32.00    Pack years: 32.00    Types: Cigarettes  . Smokeless tobacco: Never Used  Substance and Sexual Activity  . Alcohol use: Yes    Comment: occasional  . Drug use: No  . Sexual activity: Yes    Birth control/protection: None  Other Topics Concern  . Not on file  Social History Narrative  . Not on file   Social Determinants of Health   Financial Resource Strain:   . Difficulty of Paying Living Expenses:   Food Insecurity:   . Worried About Charity fundraiser in the Last Year:   . Arboriculturist in the Last Year:   Transportation Needs:   . Film/video editor (Medical):   Marland Kitchen Lack of Transportation (Non-Medical):   Physical Activity:   . Days of Exercise per Week:   . Minutes of Exercise per Session:   Stress:   . Feeling of Stress :   Social Connections:   . Frequency  of Communication with Friends and Family:   . Frequency of Social Gatherings with Friends and Family:   . Attends Religious Services:   . Active Member of Clubs or Organizations:   . Attends Archivist Meetings:   Marland Kitchen Marital Status:     Allergies  Allergen Reactions  . Asa [Aspirin] Nausea And Vomiting     Current Outpatient Medications:  .  ceFAZolin (ANCEF) IVPB, Inject 2 g into the vein every 8 (eight) hours. Indication:  discitis First Dose: Yes Last Day of Therapy:  02/16/2020 Labs - Once weekly:  CBC/D and BMP, Labs - Every other week:  ESR and CRP Method of administration: IV Push Method of administration may be changed at the discretion of home  infusion pharmacist based upon assessment of the patient and/or caregiver's ability to self-administer the medication ordered., Disp: 147 Units, Rfl: 0 .  Ibuprofen 200 MG CAPS, Take by mouth., Disp: , Rfl:  .  Multiple Vitamin (MULTIVITAMIN WITH MINERALS) TABS tablet, Take 1 tablet by mouth daily., Disp: 60 tablet, Rfl: 0 .  polyethylene glycol (MIRALAX / GLYCOLAX) 17 g packet, Take 17 g by mouth daily as needed for mild constipation., Disp: 14 each, Rfl: 0 .  cephALEXin (KEFLEX) 500 MG capsule, Take 1 capsule (500 mg total) by mouth 4 (four) times daily. Start after finishing IV abx, Disp: 120 capsule, Rfl: 1 .  metFORMIN (GLUCOPHAGE) 500 MG tablet, Take 1 tablet (500 mg total) by mouth 2 (two) times daily with a meal. (Patient not taking: Reported on 01/20/2020), Disp: 120 tablet, Rfl: 0 .  nicotine (NICODERM CQ - DOSED IN MG/24 HOURS) 14 mg/24hr patch, Place 1 patch (14 mg total) onto the skin daily. (Patient not taking: Reported on 01/20/2020), Disp: 28 patch, Rfl: 0   Review of Systems  Constitutional: Negative for chills and fever.  HENT: Negative for congestion and sore throat.   Eyes: Negative for photophobia.  Respiratory: Negative for cough, shortness of breath and wheezing.   Cardiovascular: Negative for chest pain, palpitations and leg swelling.  Gastrointestinal: Negative for abdominal pain, blood in stool, constipation, diarrhea, nausea and vomiting.  Genitourinary: Negative for dysuria, flank pain and hematuria.  Musculoskeletal: Positive for back pain. Negative for myalgias.  Skin: Negative for rash.  Neurological: Negative for dizziness, weakness and headaches.  Hematological: Does not bruise/bleed easily.  Psychiatric/Behavioral: Negative for agitation, behavioral problems and suicidal ideas.       Objective:   Physical Exam Constitutional:      General: He is not in acute distress.    Appearance: Normal appearance. He is well-developed. He is not ill-appearing or  diaphoretic.  HENT:     Head: Normocephalic and atraumatic.     Right Ear: Hearing and external ear normal.     Left Ear: Hearing and external ear normal.     Nose: No nasal deformity or rhinorrhea.  Eyes:     General: No scleral icterus.    Extraocular Movements: Extraocular movements intact.     Conjunctiva/sclera: Conjunctivae normal.     Right eye: Right conjunctiva is not injected.     Left eye: Left conjunctiva is not injected.  Neck:     Vascular: No JVD.  Cardiovascular:     Rate and Rhythm: Normal rate and regular rhythm.     Heart sounds: S1 normal and S2 normal. No murmur.  Pulmonary:     Effort: Pulmonary effort is normal. No respiratory distress.     Breath sounds: No wheezing.  Abdominal:     General: There is no distension.     Palpations: Abdomen is soft.  Musculoskeletal:        General: Normal range of motion.     Right shoulder: Normal.     Left shoulder: Normal.     Cervical back: Normal range of motion and neck supple.     Right hip: Normal.     Left hip: Normal.     Right knee: Normal.     Left knee: Normal.  Lymphadenopathy:     Head:     Right side of head: No submandibular, preauricular or posterior auricular adenopathy.     Left side of head: No submandibular, preauricular or posterior auricular adenopathy.     Cervical: No cervical adenopathy.     Right cervical: No superficial or deep cervical adenopathy.    Left cervical: No superficial or deep cervical adenopathy.  Skin:    General: Skin is warm and dry.     Coloration: Skin is not pale.     Findings: No abrasion, bruising, ecchymosis, erythema, lesion or rash.     Nails: There is no clubbing.  Neurological:     General: No focal deficit present.     Mental Status: He is alert and oriented to person, place, and time.     Sensory: No sensory deficit.     Coordination: Coordination normal.     Gait: Gait normal.  Psychiatric:        Attention and Perception: He is attentive.        Mood  and Affect: Mood normal.        Speech: Speech normal.        Behavior: Behavior normal. Behavior is cooperative.        Thought Content: Thought content normal.        Judgment: Judgment normal.     PICC line is clean dry and intact      Assessment & Plan:  MSSA discitis lumbar area: Complete 6 weeks of antibiotics intravenously and then switch to Keflex 500 mg 4 times daily.  I will have him complete at least 8 if not 10 weeks of total antibiotics.  We will reassess him in 6 weeks time.  Need for Covid vaccination strongly recommended to get an mRNA-based 2 dose vaccine

## 2020-01-21 ENCOUNTER — Encounter: Payer: Self-pay | Admitting: Infectious Disease

## 2020-01-21 DIAGNOSIS — M4626 Osteomyelitis of vertebra, lumbar region: Secondary | ICD-10-CM | POA: Diagnosis not present

## 2020-01-24 DIAGNOSIS — M4626 Osteomyelitis of vertebra, lumbar region: Secondary | ICD-10-CM | POA: Diagnosis not present

## 2020-01-28 ENCOUNTER — Encounter: Payer: Self-pay | Admitting: Infectious Disease

## 2020-01-28 DIAGNOSIS — M4626 Osteomyelitis of vertebra, lumbar region: Secondary | ICD-10-CM | POA: Diagnosis not present

## 2020-01-29 DIAGNOSIS — M545 Low back pain: Secondary | ICD-10-CM | POA: Diagnosis not present

## 2020-01-31 DIAGNOSIS — M4626 Osteomyelitis of vertebra, lumbar region: Secondary | ICD-10-CM | POA: Diagnosis not present

## 2020-02-04 DIAGNOSIS — M4626 Osteomyelitis of vertebra, lumbar region: Secondary | ICD-10-CM | POA: Diagnosis not present

## 2020-02-05 DIAGNOSIS — M4626 Osteomyelitis of vertebra, lumbar region: Secondary | ICD-10-CM | POA: Diagnosis not present

## 2020-02-07 DIAGNOSIS — M4626 Osteomyelitis of vertebra, lumbar region: Secondary | ICD-10-CM | POA: Diagnosis not present

## 2020-02-11 DIAGNOSIS — M4626 Osteomyelitis of vertebra, lumbar region: Secondary | ICD-10-CM | POA: Diagnosis not present

## 2020-02-13 ENCOUNTER — Telehealth: Payer: Self-pay

## 2020-02-13 NOTE — Telephone Encounter (Signed)
Fine to pull picc if patient then immediately starts taking Keflex 500 mg QID

## 2020-02-13 NOTE — Telephone Encounter (Signed)
Left voicemail with [facility] with orders to pull picc. Requested call back to confirm order.  Lorenso Courier, [place]

## 2020-02-13 NOTE — Telephone Encounter (Signed)
Received call from [facility] with [facility] for orders to pull picc. States antibiotics are scheduled to end on 6/20. Will forward message to MD if okay to pull picc after last dose. P: [phone] Lorenso Courier, CMA

## 2020-02-13 NOTE — Telephone Encounter (Signed)
Spoke with Rn at [facility] who was able to confirm order for pull picc.  Lorenso Courier, [place]

## 2020-02-14 DIAGNOSIS — M4626 Osteomyelitis of vertebra, lumbar region: Secondary | ICD-10-CM | POA: Diagnosis not present

## 2020-02-17 DIAGNOSIS — M4626 Osteomyelitis of vertebra, lumbar region: Secondary | ICD-10-CM | POA: Diagnosis not present

## 2020-03-05 ENCOUNTER — Encounter: Payer: Self-pay | Admitting: Infectious Disease

## 2020-03-05 ENCOUNTER — Other Ambulatory Visit: Payer: Self-pay

## 2020-03-05 ENCOUNTER — Telehealth (INDEPENDENT_AMBULATORY_CARE_PROVIDER_SITE_OTHER): Payer: BC Managed Care – PPO | Admitting: Infectious Disease

## 2020-03-05 DIAGNOSIS — M4626 Osteomyelitis of vertebra, lumbar region: Secondary | ICD-10-CM | POA: Diagnosis not present

## 2020-03-05 DIAGNOSIS — A4901 Methicillin susceptible Staphylococcus aureus infection, unspecified site: Secondary | ICD-10-CM

## 2020-03-05 NOTE — Progress Notes (Signed)
Virtual Visit via Telephone Note  I connected with Karl Aguilar on 03/05/20 at 11:15 AM EDT by telephone and verified that I am speaking with the correct person using two identifiers.  Location: Patient: [name] Provider: [facility]   I discussed the limitations, risks, security and privacy concerns of performing an evaluation and management service by telephone and the availability of in person appointments. I also discussed with the patient that there may be a patient responsible charge related to this service. The patient expressed understanding and agreed to proceed.    History of Present Illness:  Karl  Roachis a 49 y.o.maleinsulin-dependent diabetes mellitus smoking with apparent chronic lower back pain that is previously responded to physical therapy who had a skin infection in his left knee in the soft tissue that was treated by drainage and oral antibiotics by his primary care physician in December.  In January he began having low back pain and was worked up by Dr. Alfonso Ramus. He apparently responded to several courses of systemic corticosteroids continue to have relapse of his pain.  He ended up having an MRI under sedation due to his claustrophobia and this MRI which I have personally reviewed and it did indeed  show evidence of lumbar discitis.  I   Antibiotics were withheld and he had an IR guided aspirate which yielded methicillin sensitive Staph aureus.  He was placed on cefazolin and is due to complete a 6-week course of therapy on June 20.  Since having had antibiotics given for several weeks his pain improved dramatically.   He does suffer from superimposed chronic back pain as well.  Since I last saw him he has remained on keflex and back pain continues to improve though he cannot lay flat and also has pain with standing up in am. .    ROS otherwise negative   Observations/Objective:  He seems to be doing well and to have responded to  antibiotics  Assessment and Plan:  MSSA diskitis: I will see what his ESR, CRP trend was and if encouraging dc his abx  He will followup with me in roughly 2 months time  Follow Up Instructions:    I discussed the assessment and treatment plan with the patient. The patient was provided an opportunity to ask questions and all were answered. The patient agreed with the plan and demonstrated an understanding of the instructions.   The patient was advised to call back or seek an in-person evaluation if the symptoms worsen or if the condition fails to improve as anticipated.  I provided 22 minutes of non-face-to-face time during this encounter.   Alcide Evener, MD

## 2020-03-09 ENCOUNTER — Ambulatory Visit: Payer: BC Managed Care – PPO | Admitting: Infectious Disease

## 2020-04-15 ENCOUNTER — Telehealth: Payer: Self-pay

## 2020-04-15 NOTE — Telephone Encounter (Signed)
COVID-19 Pre-Screening Questions:04/15/20 ° °Do you currently have a fever (>100 °F), chills or unexplained body aches? NO  ° °Are you currently experiencing new cough, shortness of breath, sore throat, runny nose? NO  °•  °Have you been in contact with someone that is currently pending confirmation of Covid19 testing or has been confirmed to have the Covid19 virus? NO ° °**If the patient answers NO to ALL questions -  advise the patient to please call the clinic before coming to the office should any symptoms develop.  ° ° ° °

## 2020-04-16 ENCOUNTER — Ambulatory Visit (INDEPENDENT_AMBULATORY_CARE_PROVIDER_SITE_OTHER): Payer: BC Managed Care – PPO | Admitting: Infectious Disease

## 2020-04-16 ENCOUNTER — Other Ambulatory Visit: Payer: Self-pay

## 2020-04-16 ENCOUNTER — Encounter: Payer: Self-pay | Admitting: Infectious Disease

## 2020-04-16 VITALS — BP 134/89 | HR 96

## 2020-04-16 DIAGNOSIS — M4626 Osteomyelitis of vertebra, lumbar region: Secondary | ICD-10-CM

## 2020-04-16 NOTE — Progress Notes (Signed)
Chief complaint: followup for diskitis  Subjective:    Patient ID: Karl Aguilar, male    DOB: 1971-02-18, 49 y.o.   MRN: [id number]  HPI   48 y.o.maleinsulin-dependent diabetes mellitus smoking with apparent chronic lower back pain that is previously responded to physical therapy who had a skin infection in his left knee in the soft tissue that was treated by drainage and oral antibiotics by his primary care physician in December.  In January he began having low back pain and was worked up by Dr. Farris Has. He apparently responded to several courses of systemic corticosteroids continue to have relapse of his pain.  He ended up having an MRI under sedation due to his claustrophobia and this MRIwhich I have personally reviewed and it did indeedshow evidence of lumbar discitis.  I Antibiotics were withheld and he had an IR guided aspirate which yielded methicillin sensitive Staph aureus. He was placed on cefazolin completed a 6-week course of therapy followed by oral keflex.  He finished keflex in July. His LBP is DRAMATICALLY improved and he has even returned to work for [employer] working very hard.      Past Medical History:  Diagnosis Date  . History of kidney stones   . MSSA (methicillin susceptible Staphylococcus aureus) infection 01/20/2020    Past Surgical History:  Procedure Laterality Date  . APPENDECTOMY    . CHOLECYSTECTOMY N/A 08/02/2013   Procedure: LAPAROSCOPIC CHOLECYSTECTOMY;  Surgeon: Dalia Heading, MD;  Location: [facility];  Service: General;  Laterality: N/A;  . IR LUMBAR DISC ASPIRATION W/IMG GUIDE  12/27/2019  . RADIOLOGY WITH ANESTHESIA N/A 12/24/2019   Procedure: MRI LUMBER SPINE WITHOUT CONTRAST;  Surgeon: Radiologist, Medication, MD;  Location: [facility];  Service: Radiology;  Laterality: N/A;    No family history on file.    Social History   Socioeconomic History  . Marital status: Married    Spouse name: Not on file  . Number of children:  Not on file  . Years of education: Not on file  . Highest education level: Not on file  Occupational History  . Occupation: Retail banker  Tobacco Use  . Smoking status: Current Every Day Smoker    Packs/day: 1.00    Years: 32.00    Pack years: 32.00    Types: Cigarettes  . Smokeless tobacco: Never Used  Vaping Use  . Vaping Use: Never used  Substance and Sexual Activity  . Alcohol use: Yes    Comment: occasional  . Drug use: No  . Sexual activity: Yes    Birth control/protection: None  Other Topics Concern  . Not on file  Social History Narrative  . Not on file   Social Determinants of Health   Financial Resource Strain:   . Difficulty of Paying Living Expenses: Not on file  Food Insecurity:   . Worried About Programme researcher, broadcasting/film/video in the Last Year: Not on file  . Ran Out of Food in the Last Year: Not on file  Transportation Needs:   . Lack of Transportation (Medical): Not on file  . Lack of Transportation (Non-Medical): Not on file  Physical Activity:   . Days of Exercise per Week: Not on file  . Minutes of Exercise per Session: Not on file  Stress:   . Feeling of Stress : Not on file  Social Connections:   . Frequency of Communication with Friends and Family: Not on file  . Frequency of Social Gatherings with Friends and Family:  Not on file  . Attends Religious Services: Not on file  . Active Member of Clubs or Organizations: Not on file  . Attends Banker Meetings: Not on file  . Marital Status: Not on file    Allergies  Allergen Reactions  . Asa [Aspirin] Nausea And Vomiting     Current Outpatient Medications:  .  Ibuprofen 200 MG CAPS, Take by mouth., Disp: , Rfl:  .  metFORMIN (GLUCOPHAGE) 500 MG tablet, Take 1 tablet (500 mg total) by mouth 2 (two) times daily with a meal. (Patient not taking: Reported on 01/20/2020), Disp: 120 tablet, Rfl: 0 .  nicotine (NICODERM CQ - DOSED IN MG/24 HOURS) 14 mg/24hr patch, Place 1 patch (14 mg total)  onto the skin daily. (Patient not taking: Reported on 01/20/2020), Disp: 28 patch, Rfl: 0 .  polyethylene glycol (MIRALAX / GLYCOLAX) 17 g packet, Take 17 g by mouth daily as needed for mild constipation. (Patient not taking: Reported on 03/05/2020), Disp: 14 each, Rfl: 0   Review of Systems  Constitutional: Negative for activity change, appetite change, chills, diaphoresis, fatigue, fever and unexpected weight change.  HENT: Negative for congestion, rhinorrhea, sinus pressure, sneezing, sore throat and trouble swallowing.   Eyes: Negative for photophobia and visual disturbance.  Respiratory: Negative for cough, chest tightness, shortness of breath, wheezing and stridor.   Cardiovascular: Negative for chest pain, palpitations and leg swelling.  Gastrointestinal: Negative for abdominal distention, abdominal pain, anal bleeding, blood in stool, constipation, diarrhea, nausea and vomiting.  Genitourinary: Negative for difficulty urinating, dysuria, flank pain and hematuria.  Musculoskeletal: Negative for arthralgias, back pain, gait problem, joint swelling and myalgias.  Skin: Negative for color change, pallor, rash and wound.  Neurological: Negative for dizziness, tremors, weakness and light-headedness.  Hematological: Negative for adenopathy. Does not bruise/bleed easily.  Psychiatric/Behavioral: Negative for agitation, behavioral problems, confusion, decreased concentration, dysphoric mood and sleep disturbance.       Objective:   Physical Exam Constitutional:      Appearance: He is well-developed.  HENT:     Head: Normocephalic and atraumatic.  Eyes:     Conjunctiva/sclera: Conjunctivae normal.  Cardiovascular:     Rate and Rhythm: Normal rate and regular rhythm.  Pulmonary:     Effort: Pulmonary effort is normal. No respiratory distress.     Breath sounds: No wheezing.  Abdominal:     General: There is no distension.     Palpations: Abdomen is soft.  Musculoskeletal:         General: No tenderness. Normal range of motion.     Cervical back: Normal range of motion and neck supple.  Skin:    General: Skin is warm and dry.     Coloration: Skin is not pale.     Findings: No erythema or rash.  Neurological:     General: No focal deficit present.     Mental Status: He is alert and oriented to person, place, and time.  Psychiatric:        Mood and Affect: Mood normal.        Behavior: Behavior normal.        Thought Content: Thought content normal.        Judgment: Judgment normal.           Assessment & Plan:   Lumbar diskitis: this seems resolved. Check labs. RTC in 2 months

## 2020-04-17 LAB — BASIC METABOLIC PANEL
BUN: 12 mg/dL (ref 7–25)
CO2: 26 mmol/L (ref 20–32)
Calcium: 10 mg/dL (ref 8.6–10.3)
Chloride: 106 mmol/L (ref 98–110)
Creat: 1.25 mg/dL (ref 0.60–1.35)
Glucose, Bld: 119 mg/dL — ABNORMAL HIGH (ref 65–99)
Potassium: 4.7 mmol/L (ref 3.5–5.3)
Sodium: 139 mmol/L (ref 135–146)

## 2020-04-17 LAB — C-REACTIVE PROTEIN: CRP: 5.1 mg/L (ref <8.0)

## 2020-04-17 LAB — SEDIMENTATION RATE: Sed Rate: 2 mm/h (ref 0–15)

## 2020-06-13 IMAGING — MR MR LUMBAR SPINE W/O CM
4 of 5 series · 26 of 48 positions shown · non-contrast
Comparison: None.

CLINICAL DATA: Severe back pain.

EXAM:
MRI LUMBAR SPINE WITHOUT CONTRAST
TECHNIQUE: Multiplanar, multisequence MR imaging of the lumbar spine was
performed. No intravenous contrast was administered.

[Series 5: T2 · sagittal · 4.0mm · 0.73mm/px · 6 of 16 slices shown (1 of 2)]
[im 1/16]
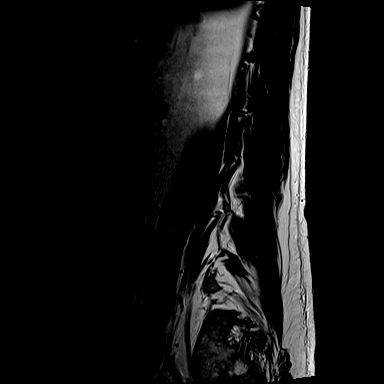
[im 4/16]
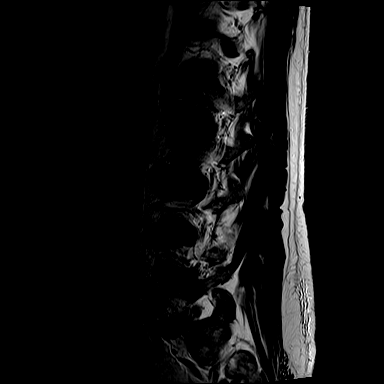
[im 7/16]
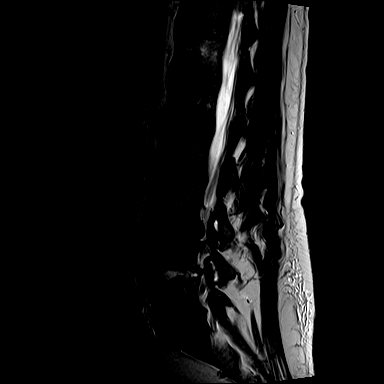
[im 10/16]
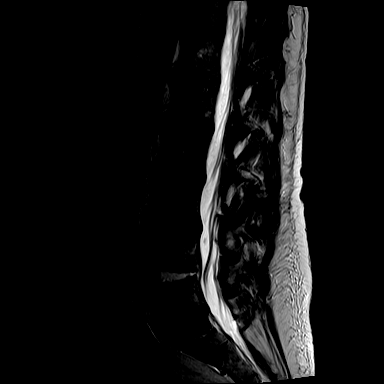
[im 13/16]
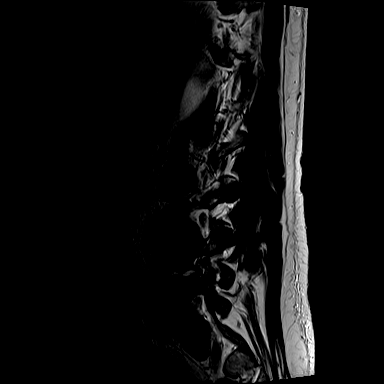
[im 16/16]
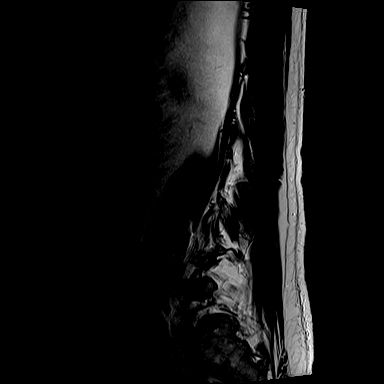

[Series 7: T1 · sagittal · 4.0mm · 0.88mm/px · 7 of 16 slices shown (1 of 2)]
[im 1/16]
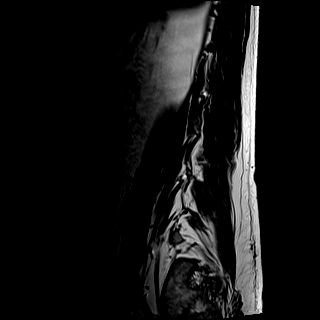
[im 3/16]
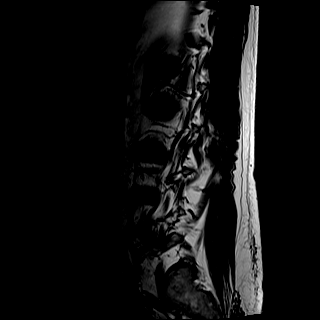
[im 6/16]
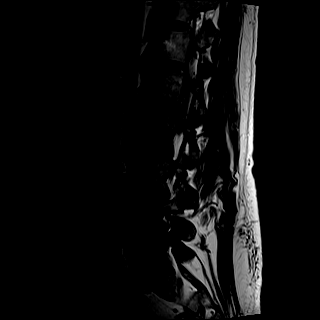
[im 8/16]
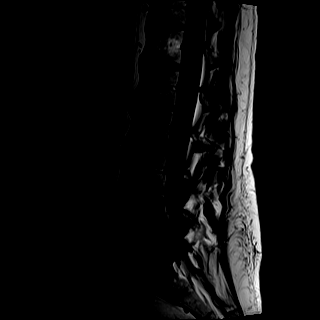
[im 11/16]
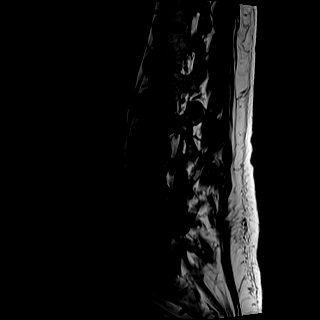
[im 13/16]
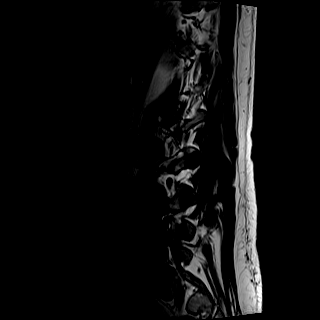
[im 16/16]
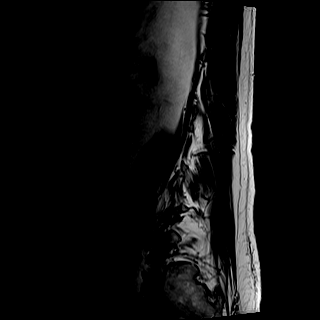

[Series 8: T2 · axial · 4.0mm · 0.57mm/px · z∈[-117,+94]mm · 8 of 35 slices shown (2 of 2)]
[im 1/35]
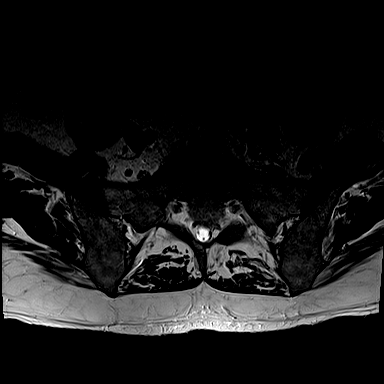
[im 6/35]
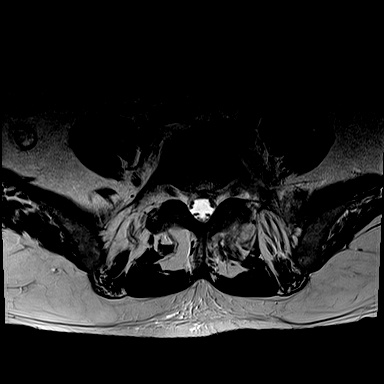
[im 11/35]
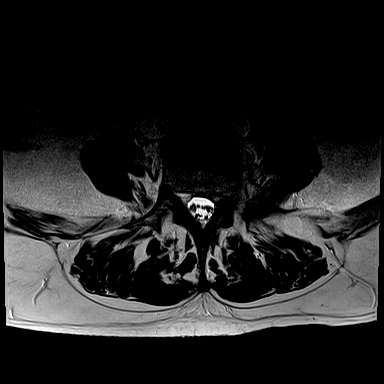
[im 16/35]
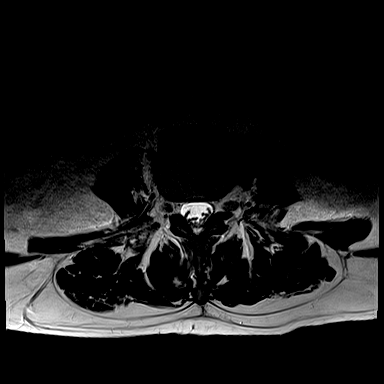
[im 19/35]
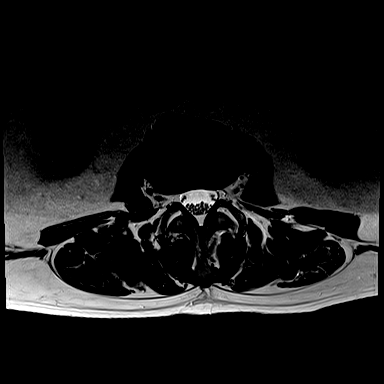
[im 24/35]
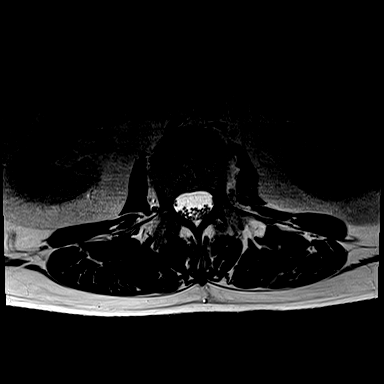
[im 29/35]
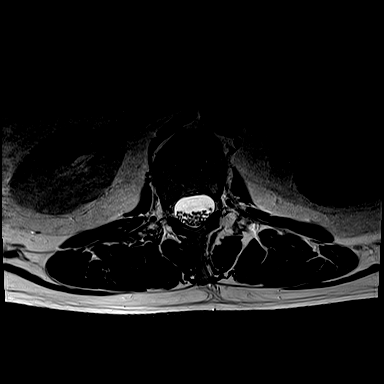
[im 35/35]
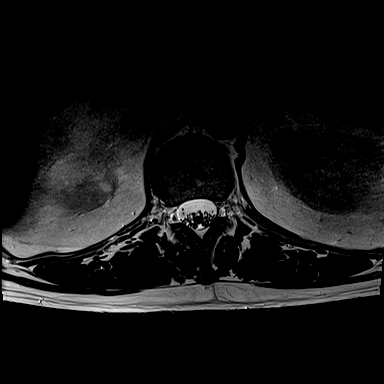

[Series 9: T1 · axial · 4.0mm · 0.34mm/px · z∈[-117,+64]mm · 5 of 35 slices shown (2 of 2)]
[im 1/35]
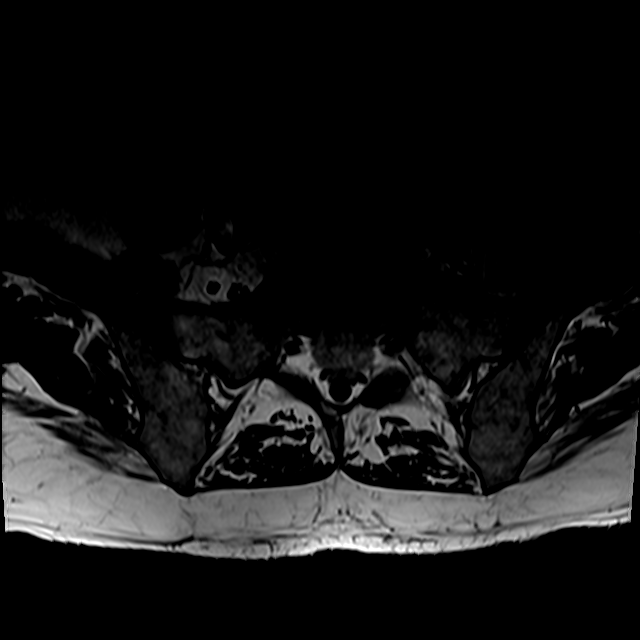
[im 6/35]
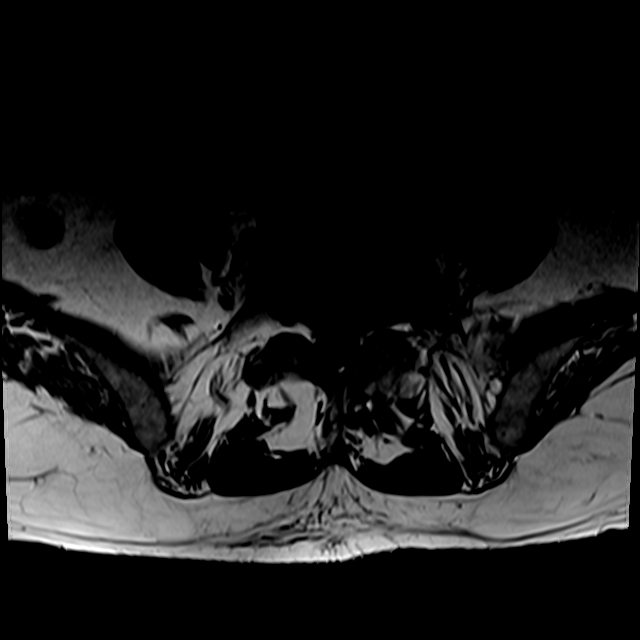
[im 11/35]
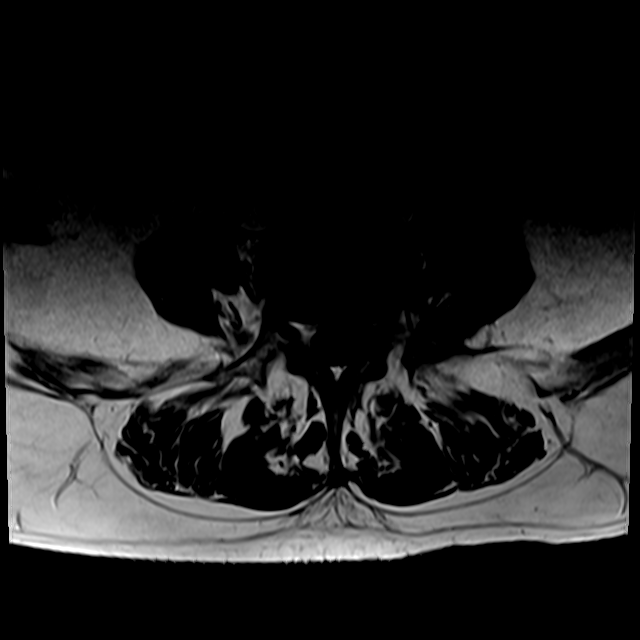
[im 19/35]
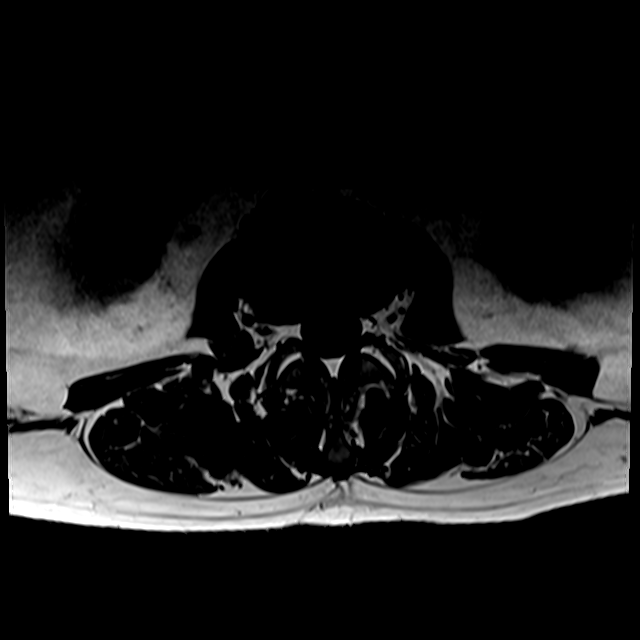
[im 29/35]
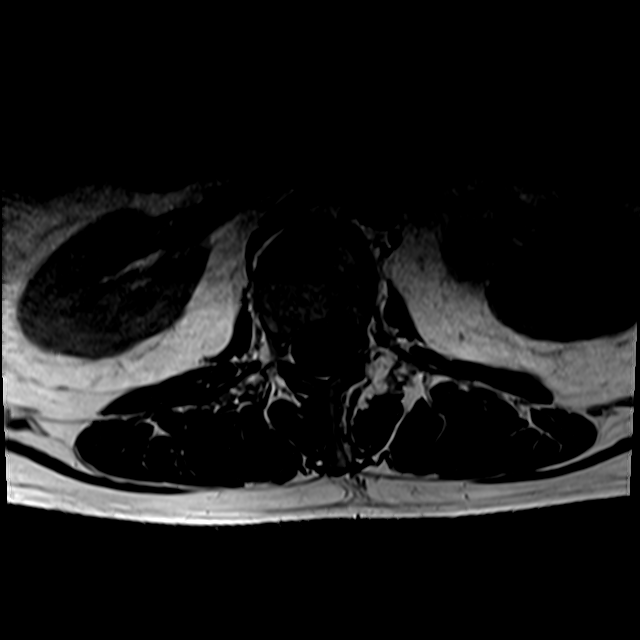

[26 of 48 positions shown; findings below may reference images not displayed]

FINDINGS: Segmentation:  Standard.

Alignment:  Physiologic.

Vertebrae: Prominent marrow edema involving the L4 and L5 vertebral
bodies extending to the bilateral L5 pedicles with associated
erosion of the opposing endplates and increased T2 signal of the
intervening disc. Findings are consistent with
discitis/osteomyelitis. No fracture or evidence of aggressive bone
lesion.

Conus medullaris and cauda equina: Conus extends to the T12-L1
level. Conus and cauda equina appear normal.

Paraspinal and other soft tissues: Small right renal cyst. Edema of
the periarticular soft tissues about the left L4-5 facet joint.

Disc levels:

T12-L1: No spinal canal or neural foraminal stenosis.

L1-2: No spinal canal or neural foraminal stenosis.

L2-3: No spinal canal or neural foraminal stenosis.

L3-4: Shallow disc bulge, mild facet degenerative changes with
bilateral joint effusion without significant spinal canal or neural
foraminal stenosis.

L4-5: Increased T2 signal of the disc which show mild bulging and
associated left subarticular disc protrusion, bilateral facet
effusion and mild ligamentum flavum redundancy. Findings result in
mild spinal canal stenosis with effacement of the left subarticular
zone which may compress on the traversing left L5 nerve root. There
is moderate bilateral neural foraminal narrowing.

L5-S1: Right central disc protrusion causing small indentation of
the thecal sac and contacting the traversing right S1 nerve root. No
spinal canal or neural foraminal stenosis.
IMPRESSION: 1. Findings consistent with L4-L5 discitis/osteomyelitis.
2. L4-5 mild spinal canal stenosis, effacement of the left
subarticular zone with possible impingement of the traversing left
L5 nerve root.
3. L5-S1 right central disc protrusion contacting the traversing
right S1 nerve root.

## 2020-06-15 IMAGING — CR DG CHEST 2V
2 series · 2 of 2 positions shown · non-contrast
Comparison: July 28, 2013.

CLINICAL DATA: Lower back pain, possible sepsis.

EXAM:
CHEST - 2 VIEW

[chest pa]
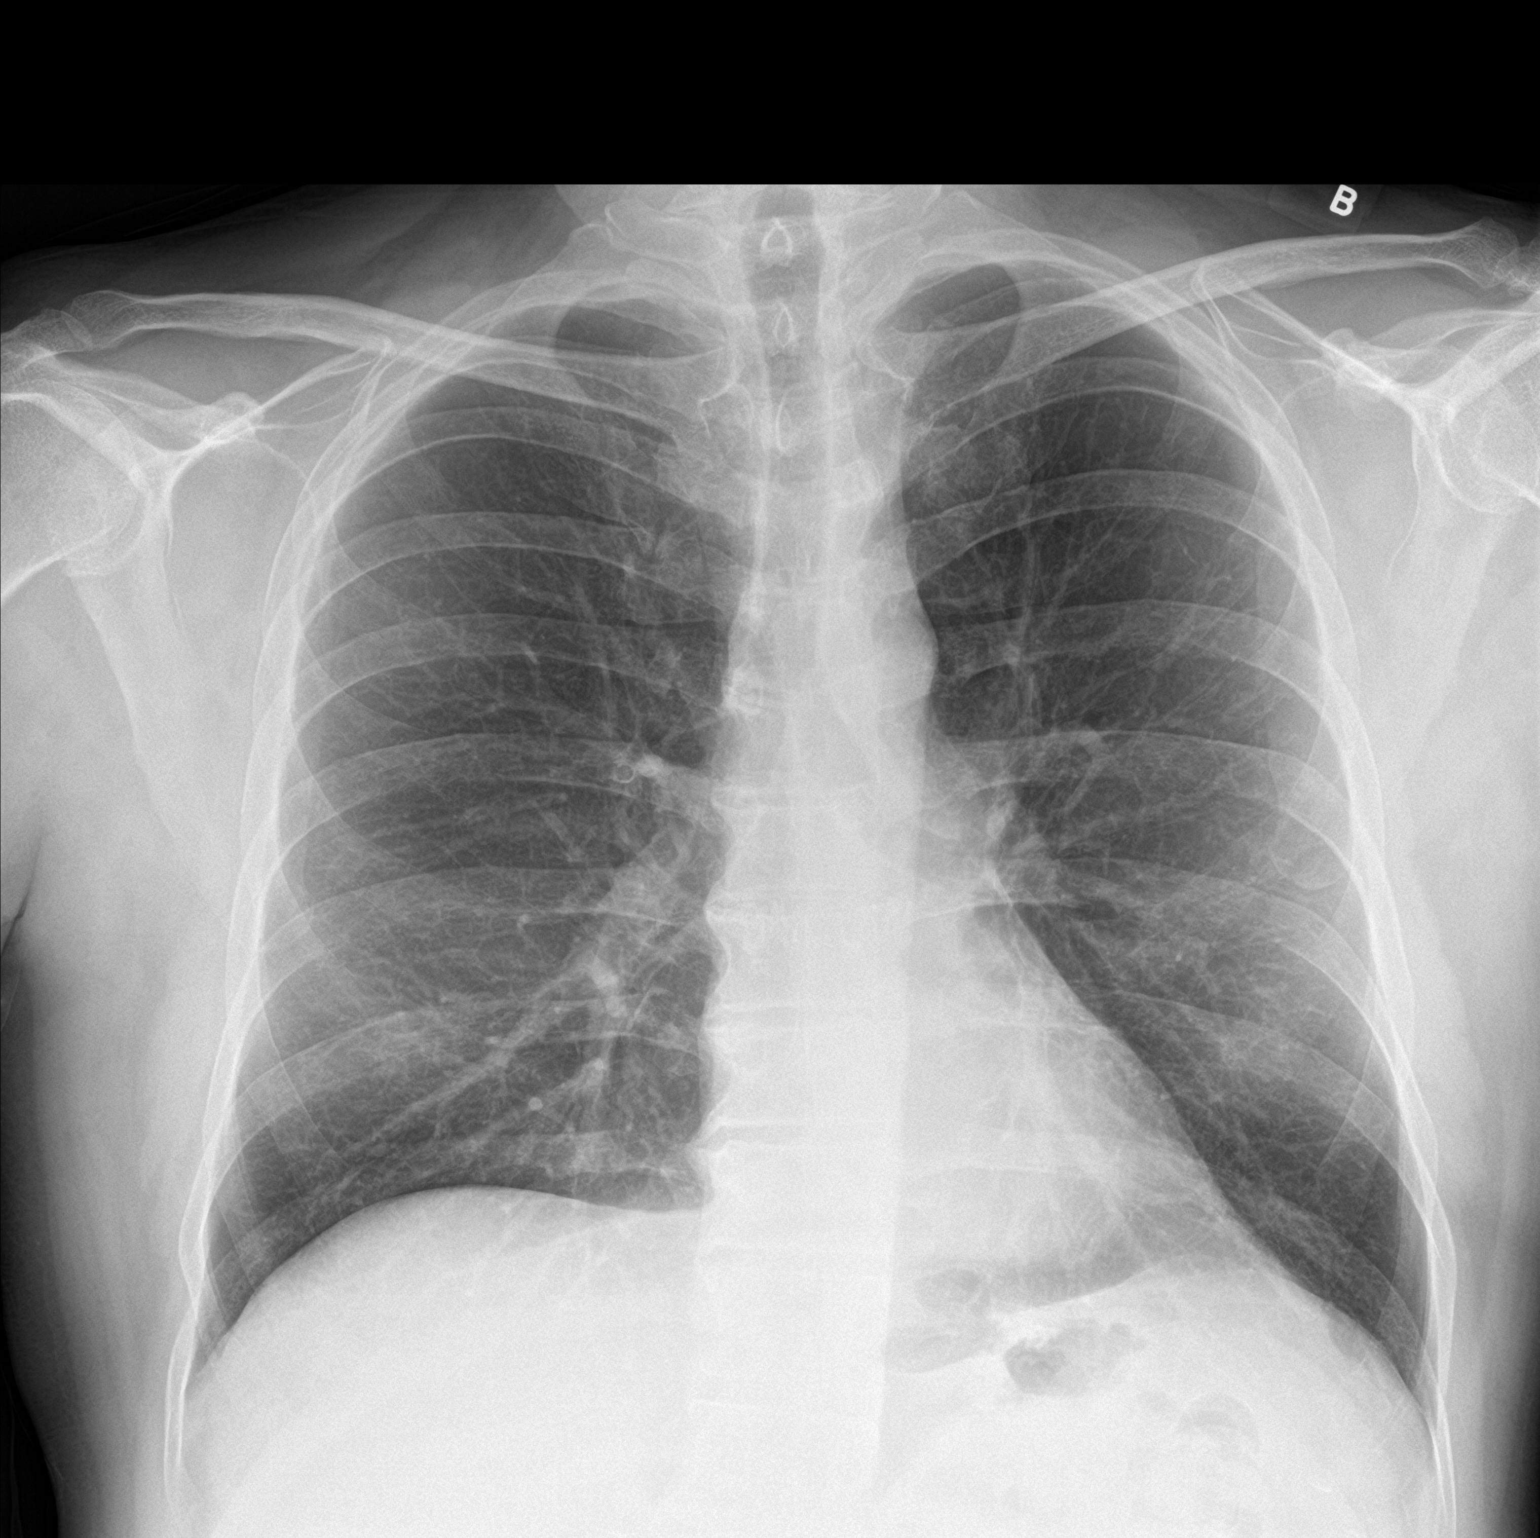

[chest lat]
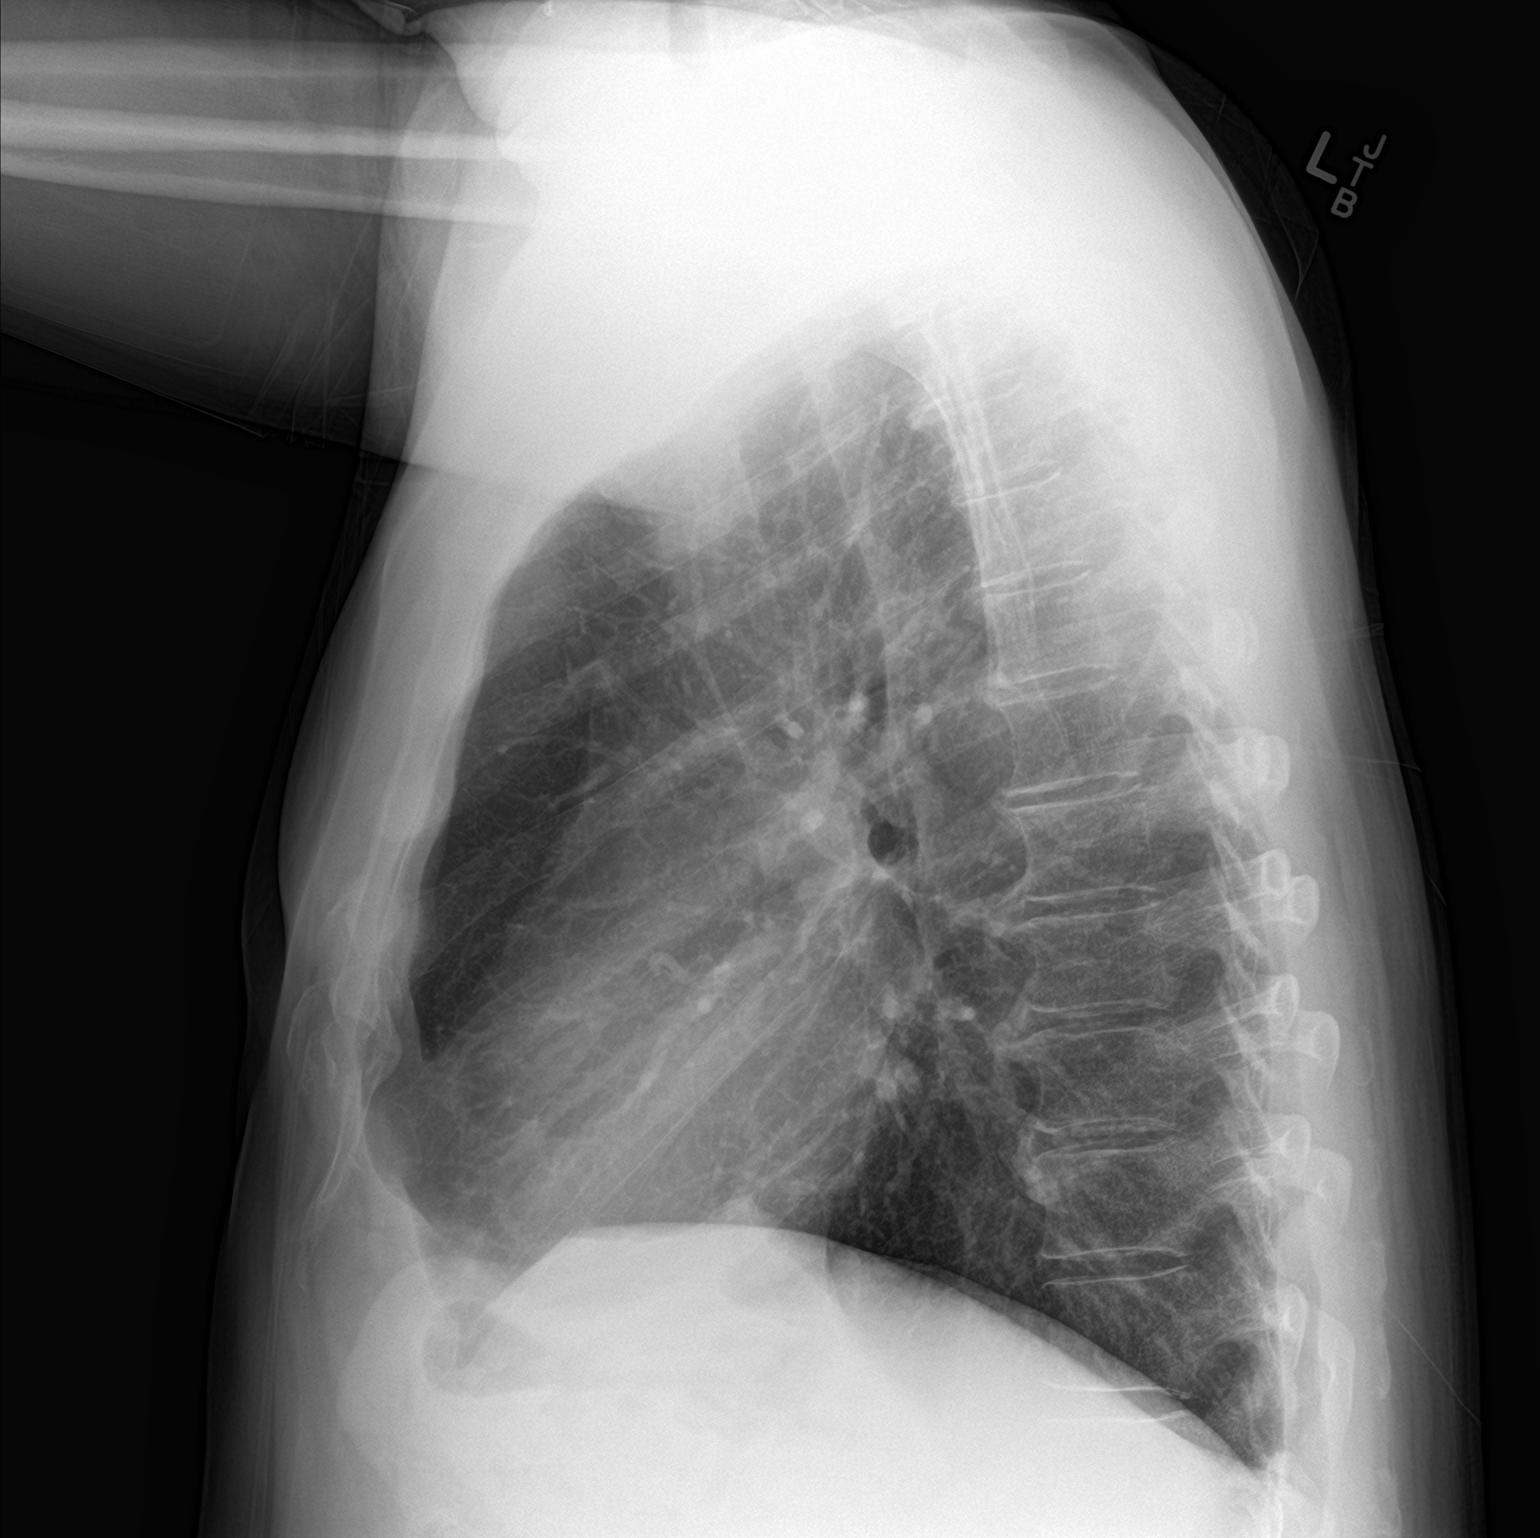

[2 of 2 positions shown; findings below may reference images not displayed]

FINDINGS: The heart size and mediastinal contours are within normal limits.
Both lungs are clear. No pneumothorax or pleural effusion is noted.
The visualized skeletal structures are unremarkable.
IMPRESSION: No active cardiopulmonary disease.

## 2020-06-16 IMAGING — XA IR DISC ASPIRATION W/IMAG GUIDE
1 series · 8 of 8 positions shown · non-contrast
Comparison: none

INDICATION: Severe low back pain secondary to L4-L5 discitis.

[Series 300: spine · 8 of 8 slices shown]
[im 1/8]
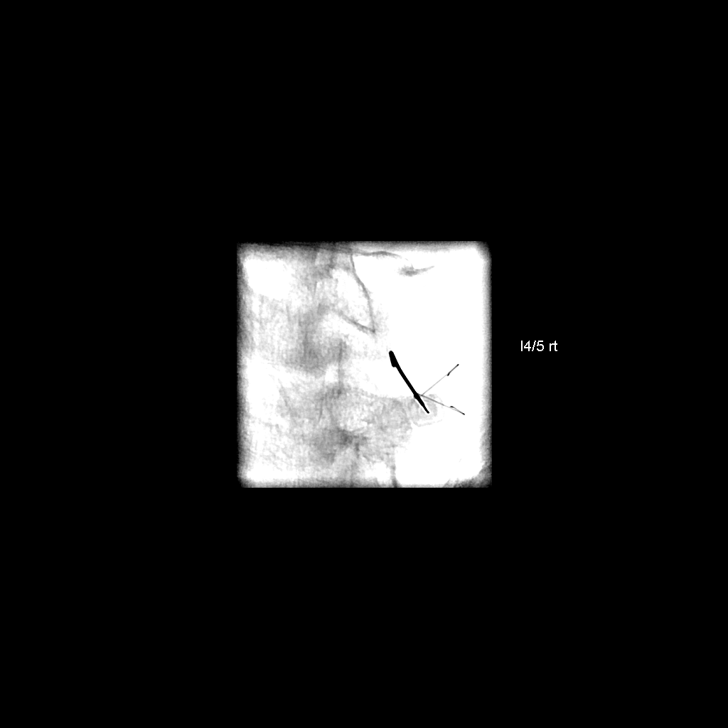
[im 2/8]
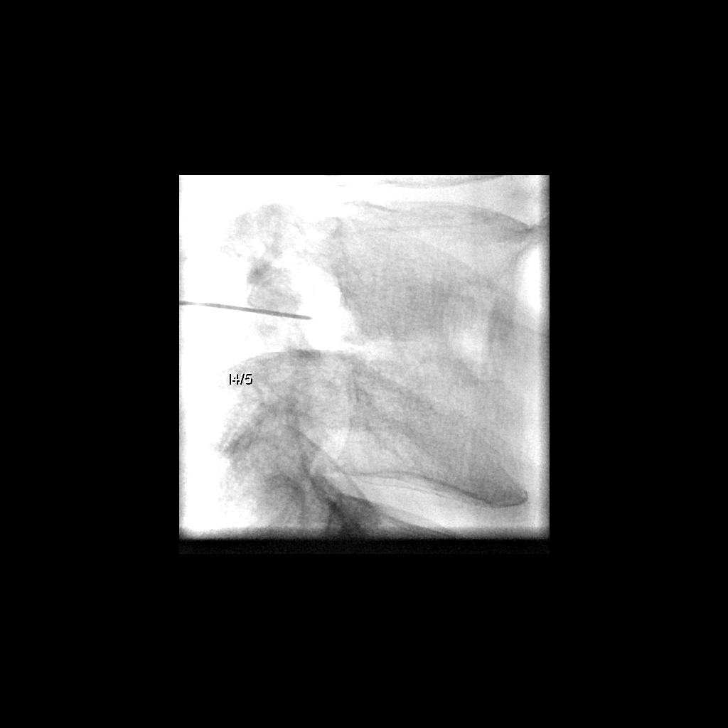
[im 3/8]
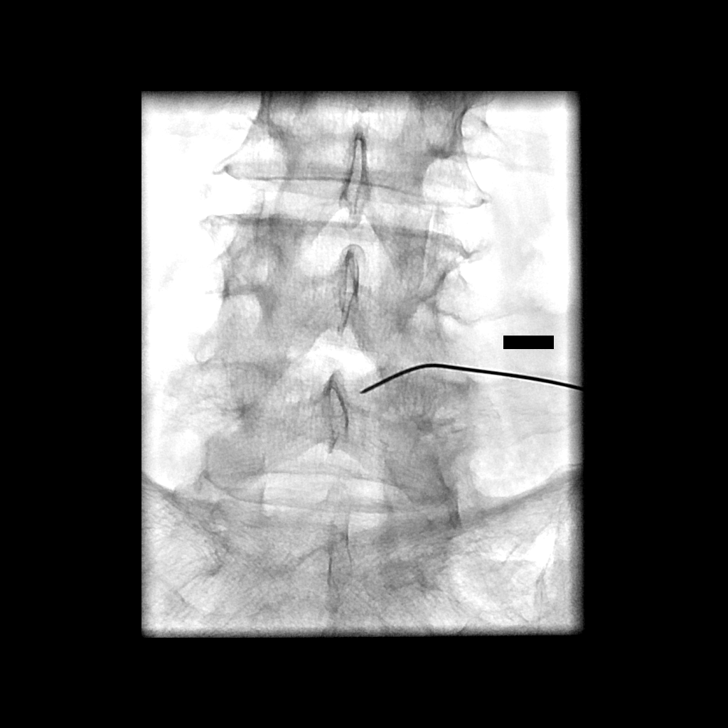
[im 4/8]
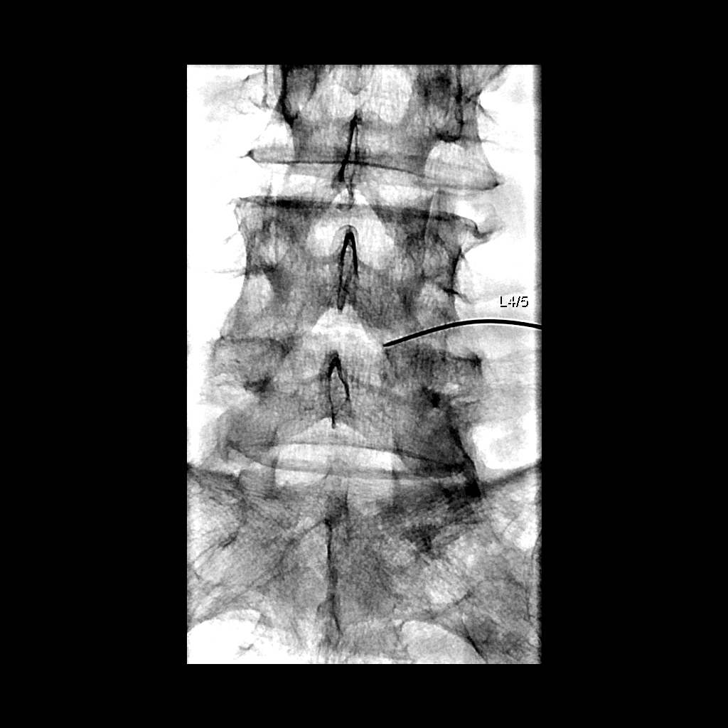
[im 5/8]
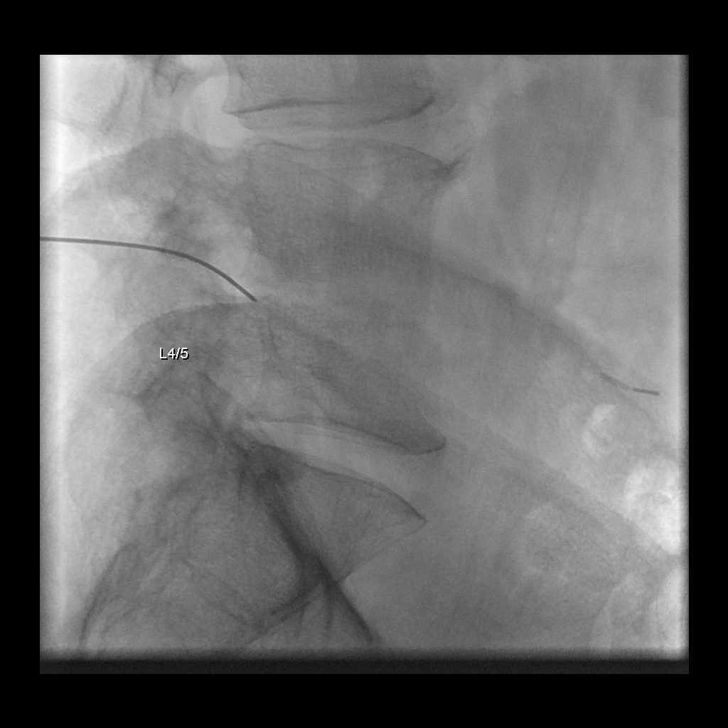
[im 6/8]
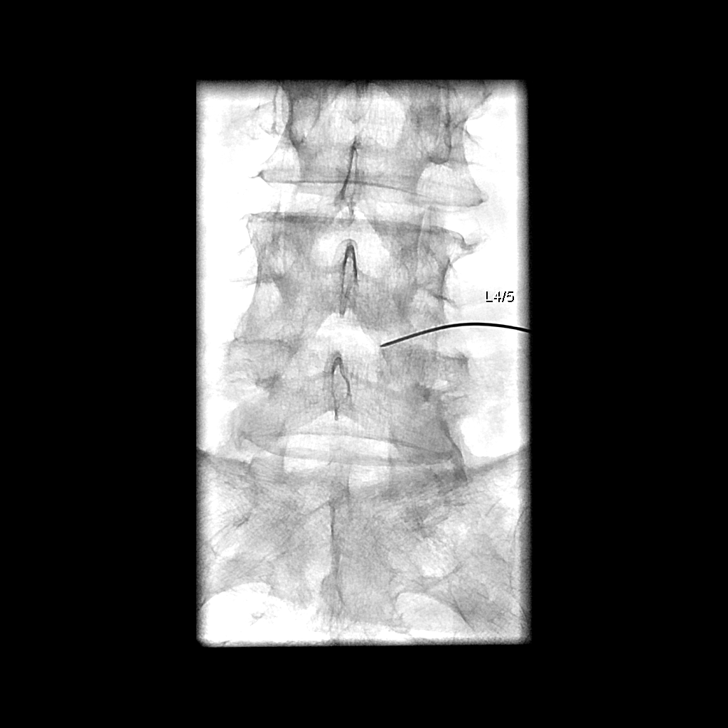
[im 7/8]
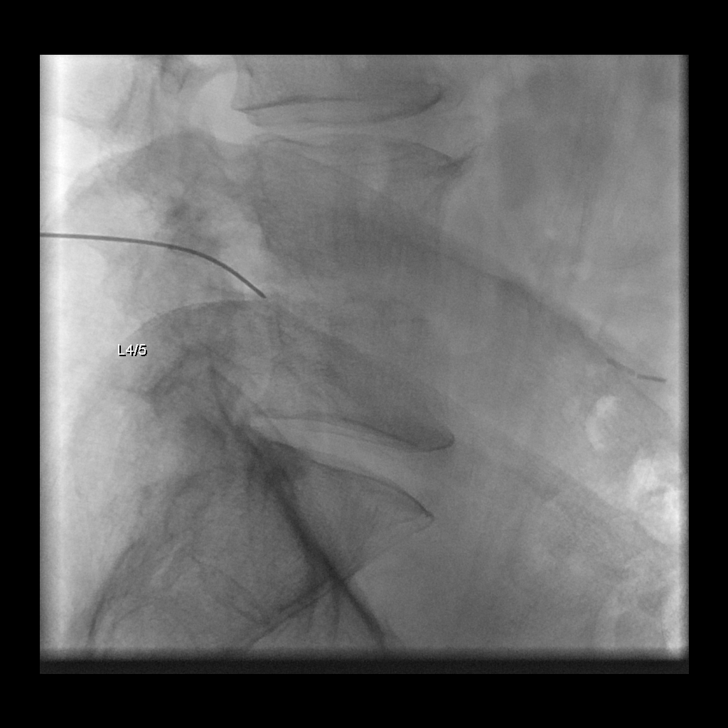
[im 8/8]
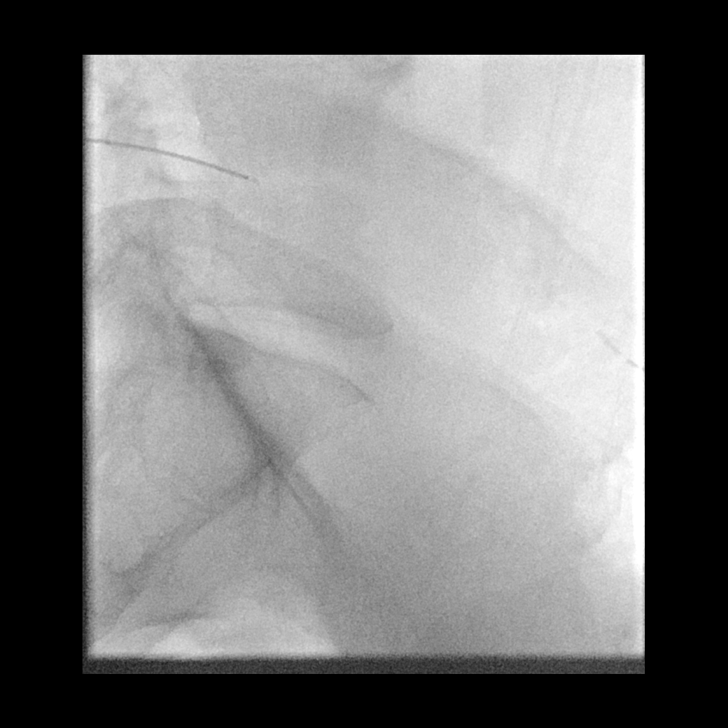

[8 of 8 positions shown; findings below may reference images not displayed]

EXAM:
FLUORO GUIDED DISC ASPIRATION AT L4-5

MEDICATIONS:
The patient is currently admitted to the hospital and receiving
intravenous antibiotics. The antibiotics were administered within an
appropriate time frame prior to the initiation of the procedure.

ANESTHESIA/SEDATION:
Fentanyl 2 mcg IV; Versed 75 mg IV.  2 mg of Dilaudid.

Moderate Sedation Time: 30 minutes.

FLUOROSCOPY TIME:  Fluoroscopy Time: 14 minutes 12 seconds (7575
mGy)

The patient was continuously monitored during the procedure by the
interventional radiology nurse under my direct supervision.

COMPLICATIONS:
None immediate.

PROCEDURE:
Informed written consent was obtained from the patient after a
thorough discussion of the procedural risks, benefits and
alternatives. All questions were addressed. Maximal Sterile Barrier
Technique was utilized including caps, mask, sterile gowns, sterile
gloves, sterile drape, hand hygiene and skin antiseptic. A timeout
was performed prior to the initiation of the procedure.

The patient was laid prone on the fluoroscopic table.

The skin overlying the lumbar region was then prepped and draped in
the usual manner. 0.25% bupivacaine was then administered at the
skin entry site extending into the musculature.

Using biplane intermittent fluoroscopy, a 21 gauge Giorgi needle
was then advanced into the L4-L5 disc space to the level of the
midline on the AP projection.

Using a 20 mL syringe, approximately 1 mL of blood stained plaque
aspirate was obtained and sent for analysis.

Hemostasis was achieved at the skin entry site. Patient tolerated
the procedure well.
IMPRESSION: Status post fluoroscopic guided disc aspiration at L4-5 for
discitis/osteomyelitis.

## 2021-05-06 DIAGNOSIS — U071 COVID-19: Secondary | ICD-10-CM | POA: Diagnosis not present

## 2022-03-18 ENCOUNTER — Telehealth: Payer: Self-pay | Admitting: *Deleted

## 2022-03-18 NOTE — Chronic Care Management (AMB) (Signed)
  Care Coordination  Note  03/18/2022 Name: Karl Aguilar MRN: [id number] DOB: May 18, 1971  Karl Aguilar is a 52 y.o. year old male who is a primary care patient of Elfredia Nevins, MD. I reached out to Ladona Mow by phone today to offer care coordination services.       Follow up plan: Unsuccessful telephone outreach attempt made. A HIPAA compliant phone message was left for the patient providing contact information and requesting a return call.  The care guide will reach out to the patient again over the next 7 days.  If patient calls provider office to request assistance with care coordination needs, please contact the care guide at the number below.   [facility]  Care Coordination Care Guide  Direct Dial: [phone]

## 2022-03-24 NOTE — Chronic Care Management (AMB) (Signed)
  Care Management   Outreach Note  03/24/2022 Name: Karl Aguilar MRN: [id number] DOB: 12-Apr-1971  A second unsuccessful telephone outreach was attempted today. The patient was referred to the case management team for assistance with care management and care coordination.   Follow Up Plan:  A HIPAA compliant phone message was left for the patient providing contact information and requesting a return call.  The care management team will reach out to the patient again over the next 7 days.  If patient returns call to provider office, please advise to call Embedded Care Management Care Guide Misty Stanley* at [phone].Misty Stanley [facility]  Care Coordination Care Guide  Direct Dial: [phone]

## 2022-03-30 NOTE — Chronic Care Management (AMB) (Signed)
  Care Coordination  Outreach Note  03/30/2022 Name: Karl Aguilar MRN: [id number] DOB: [birth year]   Care Coordination Outreach Attempts  A third unsuccessful outreach was attempted today to offer the patient with information about available care coordination services as a benefit of their health plan.   Follow Up Plan:  No further outreach attempts will be made at this time. We have been unable to contact the patient to offer or enroll patient in care coordination services  Encounter Outcome:  No Answer  Gwenevere Ghazi  Care Coordination Care Guide  Direct Dial: [phone]

## 2022-08-01 ENCOUNTER — Other Ambulatory Visit: Payer: Self-pay | Admitting: *Deleted

## 2022-08-01 NOTE — Patient Outreach (Signed)
  Care Coordination   08/01/2022  Name: Karl Aguilar MRN: [id number] DOB: 02-16-1971   Care Coordination Outreach Attempts:  An unsuccessful telephone outreach was attempted today to offer the patient information about available care coordination services as a benefit of their health plan. HIPAA compliant message left on voicemail, providing contact information for CSW, encouraging patient to return CSW's call at his earliest convenience.  Follow Up Plan:  Additional outreach attempts will be made to offer the patient care coordination information and services.   Encounter Outcome:  No Answer.   Care Coordination Interventions:  No, not indicated.    Danford Bad, BSW, MSW, LCSW  Licensed Restaurant manager, [employer] [facility]  Mailing [place] [address], [place], [place] [address] Physical Address[address] [place], [place] [address] Toll Free Main # [phone] Fax # [phone] Cell # [phone] [email or web address]

## 2022-08-04 ENCOUNTER — Other Ambulatory Visit: Payer: Self-pay | Admitting: *Deleted

## 2022-08-04 NOTE — Patient Outreach (Signed)
  Care Coordination   08/04/2022  Name: Karl Aguilar MRN: [id number] DOB: 25-Feb-1971   Care Coordination Outreach Attempts:  A second unsuccessful outreach was attempted today to offer the patient with information about available care coordination services as a benefit of their health plan.   HIPAA compliant message left on voicemail, providing contact information for CSW, encouraging patient to return CSW's call at his earliest convenience.   Follow Up Plan:  Additional outreach attempts will be made to offer the patient care coordination information and services.    Encounter Outcome:  No Answer.    Care Coordination Interventions:  No, not indicated.     Danford Bad, BSW, MSW, LCSW  Licensed Restaurant manager, [employer] [facility]  Mailing [place] [address], [place], [place] [address] Physical Address[address] [place], [place] [address] Toll Free Main # [phone] Fax # [phone] Cell # [phone] [email or web address]

## 2022-08-12 ENCOUNTER — Other Ambulatory Visit: Payer: Self-pay | Admitting: *Deleted

## 2022-08-12 NOTE — Patient Outreach (Signed)
  Care Coordination   08/12/2022  Name: Karl Aguilar MRN: [id number] DOB: 1971-03-14   Care Coordination Outreach Attempts:  A third unsuccessful outreach was attempted today to offer the patient with information about available care coordination services as a benefit of their health plan. HIPAA compliant message left on voicemail, providing contact information for CSW, encouraging patient to return CSW's call at his earliest convenience.  Follow Up Plan:  No further outreach attempts will be made at this time. We have been unable to contact the patient to offer or enroll patient in care coordination services.  Encounter Outcome:  No Answer.   Care Coordination Interventions:  No, not indicated.    Danford Bad, BSW, MSW, LCSW  Licensed Restaurant manager, [employer] [facility]  Mailing [place] [address], [place], [place] [address] Physical Address[address], [place], [place] [address] Toll Free Main # [phone] Fax # [phone] Cell # [phone] [email or web address]
